# Patient Record
Sex: Male | Born: 1950
Health system: Southern US, Community
[De-identification: ages and names within clinical notes are randomized; demographics above are authoritative.]

## PROBLEM LIST (undated history)

## (undated) DIAGNOSIS — Z972 Presence of dental prosthetic device (complete) (partial): Secondary | ICD-10-CM

## (undated) DIAGNOSIS — I10 Essential (primary) hypertension: Secondary | ICD-10-CM

## (undated) DIAGNOSIS — G709 Myoneural disorder, unspecified: Secondary | ICD-10-CM

## (undated) DIAGNOSIS — M1990 Unspecified osteoarthritis, unspecified site: Secondary | ICD-10-CM

## (undated) DIAGNOSIS — M48062 Spinal stenosis, lumbar region with neurogenic claudication: Secondary | ICD-10-CM

## (undated) DIAGNOSIS — M545 Low back pain, unspecified: Secondary | ICD-10-CM

## (undated) DIAGNOSIS — G8929 Other chronic pain: Secondary | ICD-10-CM

## (undated) DIAGNOSIS — C801 Malignant (primary) neoplasm, unspecified: Secondary | ICD-10-CM

## (undated) DIAGNOSIS — E7800 Pure hypercholesterolemia, unspecified: Secondary | ICD-10-CM

## (undated) DIAGNOSIS — G35 Multiple sclerosis: Secondary | ICD-10-CM

## (undated) DIAGNOSIS — M5416 Radiculopathy, lumbar region: Secondary | ICD-10-CM

## (undated) DIAGNOSIS — I447 Left bundle-branch block, unspecified: Secondary | ICD-10-CM

## (undated) HISTORY — PX: MULTIPLE TOOTH EXTRACTIONS: SHX2053

## (undated) HISTORY — PX: NO PAST SURGERIES: SHX2092

---

## 2011-04-19 ENCOUNTER — Other Ambulatory Visit: Payer: Self-pay | Admitting: Urology

## 2011-05-01 ENCOUNTER — Encounter (HOSPITAL_COMMUNITY): Payer: Self-pay | Admitting: Pharmacy Technician

## 2011-05-04 ENCOUNTER — Encounter (HOSPITAL_COMMUNITY)
Admission: RE | Admit: 2011-05-04 | Discharge: 2011-05-04 | Disposition: A | Discharge status: Home / Self Care | Payer: Medicare Other | Source: Ambulatory Visit | Attending: Urology | Admitting: Urology

## 2011-05-04 ENCOUNTER — Encounter (HOSPITAL_COMMUNITY): Payer: Self-pay

## 2011-05-04 ENCOUNTER — Ambulatory Visit (HOSPITAL_COMMUNITY)
Admission: RE | Admit: 2011-05-04 | Discharge: 2011-05-04 | Disposition: A | Discharge status: Home / Self Care | Payer: Medicare Other | Source: Ambulatory Visit | Attending: Urology | Admitting: Urology

## 2011-05-04 ENCOUNTER — Other Ambulatory Visit: Payer: Self-pay

## 2011-05-04 DIAGNOSIS — Z01810 Encounter for preprocedural cardiovascular examination: Secondary | ICD-10-CM | POA: Insufficient documentation

## 2011-05-04 DIAGNOSIS — Z01818 Encounter for other preprocedural examination: Secondary | ICD-10-CM | POA: Insufficient documentation

## 2011-05-04 DIAGNOSIS — C61 Malignant neoplasm of prostate: Secondary | ICD-10-CM | POA: Insufficient documentation

## 2011-05-04 DIAGNOSIS — Z01812 Encounter for preprocedural laboratory examination: Secondary | ICD-10-CM | POA: Insufficient documentation

## 2011-05-04 HISTORY — DX: Other chronic pain: G89.29

## 2011-05-04 HISTORY — DX: Low back pain: M54.5

## 2011-05-04 HISTORY — DX: Multiple sclerosis: G35

## 2011-05-04 HISTORY — DX: Malignant (primary) neoplasm, unspecified: C80.1

## 2011-05-04 HISTORY — DX: Low back pain, unspecified: M54.50

## 2011-05-04 LAB — CBC
MCHC: 34.2 g/dL (ref 30.0–36.0)
Platelets: 301 10*3/uL (ref 150–400)
RDW: 13.4 % (ref 11.5–15.5)

## 2011-05-04 LAB — SURGICAL PCR SCREEN
MRSA, PCR: NEGATIVE
Staphylococcus aureus: NEGATIVE

## 2011-05-04 LAB — BASIC METABOLIC PANEL
GFR calc Af Amer: 87 mL/min — ABNORMAL LOW (ref >90)
GFR calc non Af Amer: 75 mL/min — ABNORMAL LOW (ref >90)
Potassium: 3.6 mEq/L (ref 3.5–5.1)
Sodium: 139 mEq/L (ref 135–145)

## 2011-05-04 NOTE — Patient Instructions (Signed)
20 Paul Sutton  05/04/2011   Your procedure is scheduled on: 04-21-11 Report to [facility] at 0515   AM.  Call this number if you have problems the morning of surgery: [phone]   Remember:   Do not eat food or drink liquids:After Midnight.  Take these medicines the morning of surgery with A SIP OF WATER: amlodipine, pravastatin   Do not wear jewelry...  Do not wear lotions, powders, or perfumes. Do not wear deodorant.  .  Do not bring valuables to the hospital.  Contacts, dentures or bridgework may not be worn into surgery.  Leave suitcase in the car. After surgery it may be brought to your room.  For patients admitted to the hospital, checkout time is 11:00 AM the day of discharge.     Special Instructions: CHG Shower Use Special Wash: 1/2 bottle night before surgery and 1/2 bottle morning of surgery.neck down avoid private area   Please read over the following fact sheets that you were given: MRSA Information  Jasmine December Maheen Cwikla rn [facility] pre op nurse phone number [phone] call if needed

## 2011-05-07 NOTE — Pre-Procedure Instructions (Signed)
Spoke with dr Council Mechanic, ok with 05-04-11 ekg results, faxed ekg results to dr borden and left message with chasity little.

## 2011-05-10 ENCOUNTER — Inpatient Hospital Stay (HOSPITAL_COMMUNITY): Admission: RE | Admit: 2011-05-10 | Payer: Medicare Other | Source: Ambulatory Visit | Admitting: Urology

## 2011-05-10 ENCOUNTER — Encounter (HOSPITAL_COMMUNITY): Admission: RE | Payer: Self-pay | Source: Ambulatory Visit

## 2011-05-10 SURGERY — ROBOTIC ASSISTED LAPAROSCOPIC RADICAL PROSTATECTOMY LEVEL 2
Anesthesia: General

## 2011-05-21 ENCOUNTER — Other Ambulatory Visit: Payer: Self-pay | Admitting: Urology

## 2011-05-25 ENCOUNTER — Encounter (HOSPITAL_COMMUNITY): Payer: Self-pay | Admitting: Pharmacy Technician

## 2011-05-30 ENCOUNTER — Encounter (HOSPITAL_COMMUNITY): Payer: Self-pay

## 2011-05-30 ENCOUNTER — Encounter (HOSPITAL_COMMUNITY)
Admission: RE | Admit: 2011-05-30 | Discharge: 2011-05-30 | Disposition: A | Discharge status: Home / Self Care | Payer: Medicare Other | Source: Ambulatory Visit | Attending: Urology | Admitting: Urology

## 2011-05-30 HISTORY — DX: Myoneural disorder, unspecified: G70.9

## 2011-05-30 HISTORY — DX: Unspecified osteoarthritis, unspecified site: M19.90

## 2011-05-30 HISTORY — DX: Essential (primary) hypertension: I10

## 2011-05-30 LAB — BASIC METABOLIC PANEL
Chloride: 104 mEq/L (ref 96–112)
Creatinine, Ser: 1.01 mg/dL (ref 0.50–1.35)
GFR calc Af Amer: >90 mL/min (ref >90)
GFR calc non Af Amer: 78 mL/min — ABNORMAL LOW (ref >90)
Potassium: 4.3 mEq/L (ref 3.5–5.1)

## 2011-05-30 LAB — CBC
MCHC: 34.1 g/dL (ref 30.0–36.0)
Platelets: 373 10*3/uL (ref 150–400)
RDW: 13.5 % (ref 11.5–15.5)
WBC: 5.1 10*3/uL (ref 4.0–10.5)

## 2011-05-30 LAB — SURGICAL PCR SCREEN: Staphylococcus aureus: NEGATIVE

## 2011-05-30 MED ORDER — CEFAZOLIN SODIUM 1-5 GM-% IV SOLN: 1.0000 g | INTRAVENOUS | Status: DC

## 2011-05-30 NOTE — Pre-Procedure Instructions (Signed)
05/11/11 LOV with cardiology Dr Eldridge Dace on chart  Stress Test 05/15/11 on chart  EKG 05/15/11 on chart  05/04/11 CXR in [facility]

## 2011-05-30 NOTE — Patient Instructions (Signed)
20 Paul Sutton  05/30/2011   Your procedure is scheduled on:  06/07/11 [id number]  Report to [facility] at 0515 AM.  Call this number if you have problems the morning of surgery: [phone]   Remember:   Do not eat food:After Midnight.  May have clear liquids:until Midnight .    Take these medicines the morning of surgery with A SIP OF WATER:    Do not wear jewelry,   Do not wear lotions, powders, or perfumes.  .  Do not bring valuables to the hospital.  Contacts, dentures or bridgework may not be worn into surgery.             Leave suitcase in car.   Someone to bring up to room after surgery.              The day your are discharged from hospital checkout time will be around 1100am.           Special Instructions: CHG Shower Use Special Wash: 1/2 bottle night before surgery and 1/2 bottle morning of surgery. Shower chin to toes with CHG.  Wash face and private parts with regular soap.    Please read over the following fact sheets that you were given: MRSA Information, Blood Transfusion Fact Sheet, Incentive Spirometry Fact Sheet, coughing and deep breathing exercises, leg exercises

## 2011-06-06 NOTE — H&P (Signed)
History of Present Illness  Mr. Lindblad is a 61 year old patient with a history of recurrent urinary tract infection/prostatitis and a history of an elevated PSA. His elevated PSA has apparently been felt to be related to his prostatitis in the past. However, his PSA was found to be rising and persistently elevated more recently. His PSA from 02/03/11 was 8.4 with a free percentage of 5%. This prompted a prostate biopsy performed by Dr. Ian Bushman on 03/16/11 which indicated Gleason 4+3 = 7 adenocarcinoma of the prostate with 6 of 10 cores involved with malignancy. He does have a paternal family history of prostate cancer with his father having been diagnosed in his 12s and undergoing radical prostatectomy for treatment. He has not proceeded with a radiation oncology consultation although is most interested in surgical treatment.  He did undergo staging by Dr. Ian Bushman including a CT scan of the abdomen and pelvis as well as a bone scan. These studies were both performed on 04/03/11 with neither indicating concern for metastatic disease. He was evaluated by me in January 2013 and elected to undergo surgical treatment with a robotic prostatectomy.  TNM stage: cT2b N0 M0 PSA: 8.4 Gleason score: 4+3 = 7 Biopsy (03/16/11, read by Dr. Hollice Espy at [facility] pathology - case number [id number]): 6/10 cores positive   Left: Gleason 3+3 = 6 involving less than 1% of 1/5 biopsy cores with evidence of acute and chronic inflammation    Right: Gleason 4+3 = 7 involving 65% of the total tissue including 5 out of 5 biopsy cores. Prostate volume: 20 cc  Nomogram: OC disease: 37% EPE: 70% SVI: 31% LNI: 5.4% PFS (surgery): 70%, 58%  Urinary function: He does have mild to moderate symptoms including intermittency, urgency, weak stream, and straining. IPSS: 11. These symptoms are not particular bothersome to him. Erectile function: He does have moderate erectile dysfunction. SHIM score: 15. He estimates that he can  obtain an erection adequate for intercourse approximately 50% of the time.  Interval history:  He was scheduled to undergo a robotic prostatectomy last week. However, this was canceled for 2 reasons. He was noted to have persistent bacteriuria and his urine culture subsequently demonstrated a resistant Escherichia coli sensitive to nitrofurantoin but resistant to doxycycline. He therefore follows up today for further evaluation including a repeat urinalysis and to check a postvoid residual to ensure that it is not significantly elevated. In addition, he was noted to have a bundle branch block on his preoperative EKG. He therefore has undergone a cardiac evaluation by Dr. Manuella Ghazi. He had a stress test earlier today and is awaiting the final results. He remains asymptomatic from his bacteria and has had no chest pain, shortness of breath, or palpitations.   Past Medical History Problems  1. History of  Anxiety (Symptom) 300.00 2. History of  Arthritis V13.4 3. History of  Generalized Multiple Sclerosis 340 4. History of  Hypercholesterolemia 272.0 5. History of  Hypertension 401.9  Surgical History Problems  1. History of  No Surgical Problems  Current Meds 1. AmLODIPine Besylate 10 MG Oral Tablet; Therapy: (Recorded:29Jan2013) to 2. Baclofen 10 MG Oral Tablet; Therapy: (Recorded:29Jan2013) to 3. Diazepam 5 MG Oral Tablet; Therapy: (Recorded:29Jan2013) to 4. Lorcet Plus 7.5-650 MG Oral Tablet; Therapy: (Recorded:29Jan2013) to 5. Nitrofurantoin Monohyd Macro 100 MG Oral Capsule; TAKE 1 CAPSULE EVERY 12 HOURS  DAILY; Therapy: 18Feb2013 to (Complete:04Mar2013)  Requested for: 18Feb2013; Last  Rx:18Feb2013 6. TraMADol HCl 50 MG Oral Tablet; Therapy: (Recorded:29Jan2013) to 7. Viagra 100 MG  Oral Tablet; Therapy: (Recorded:29Jan2013) to  Allergies Medication  1. No Known Drug Allergies  Family History Problems  1. Family history of  Diabetes Mellitus V18.0 2. Family history of  Heart  Disease V17.49 3. Paternal history of  Prostate Cancer V16.42 4. Family history of  Stroke Syndrome V17.1  Social History Problems  1. Activities Of Daily Living 2. Current Every Day Smoker 305.1 smokes about a half a pack daily - has smoked since about 1978 3. Exercise Habits No regular exercise but he is active at home with all activities and light chores. 4. Living Independently 5. Marital History - Divorced V61.03 6. Physical Disability Affecting Ability To Work 7. Self-reliant In Usual Daily Activities Denied  8. History of  Alcohol Use  Vitals Vital Signs [Data Includes: Last 1 Day]  26Feb2013 02:34PM  Blood Pressure: 157 / 80 Heart Rate: 79  Physical Exam Constitutional: Well nourished and well developed . No acute distress.  Pulmonary: No respiratory distress and normal respiratory rhythm and effort.  Cardiovascular: Heart rate and rhythm are normal . No peripheral edema.    Results/Data Urine [Data Includes: Last 1 Day]   26Feb2013  COLOR YELLOW   APPEARANCE CLEAR   SPECIFIC GRAVITY 1.025   pH 5.5   GLUCOSE NEG mg/dL  BILIRUBIN NEG   KETONE NEG mg/dL  BLOOD TRACE   PROTEIN NEG mg/dL  UROBILINOGEN 0.2 mg/dL  NITRITE NEG   LEUKOCYTE ESTERASE NEG   SQUAMOUS EPITHELIAL/HPF RARE   WBC 4-6 WBC/hpf  RBC 0-3 RBC/hpf  BACTERIA RARE   CRYSTALS NONE SEEN   CASTS Granular casts noted   Other MOD MUCUS     PVR: 33 cc   Plan Asymptomatic Bacteruria (791.9)  1. URINE CULTURE  Done: 26Feb2013 Health Maintenance (V70.0)  2. UA With REFLEX  Done: 26Feb2013 02:05PM Prostate Cancer (185)  3. Follow-up Office  Follow-up  Requested for: 26Feb2013  Discussion/Summary 1. Prostate cancer: He appears to be on appropriate antibiotic therapy at this time and will complete his course. I will await the final results of his stress testing and cardiac evaluation and assuming that he would remain at low risk from a cardiac standpoint, he will then be rescheduled for a left  nerve sparing robotic prostatectomy and pelvic lymphadenectomy as previously planned.  He has been evaluated by Dr. Eldridge Dace and underwent a stress test with no inducible ischemia and was felt to be at low risk for cardiac complications with his upcoming surgery.   CC: Dr. Debroah Loop Dr. Cheryln Manly   Verified Results URINE CULTURE1 26Feb2013 04:10PM1 Lyda Perone  Source : Brigid Re CATCH   Test Name Result Flag Reference  CULTURE, URINE1 Culture, Urine1    ===== COLONY COUNT: =====  NO GROWTH   FINAL REPORT: NO GROWTH     1. Amended By: Heloise Purpura; 05/17/2011 1:00 PMEST  Signatures Electronically signed by : Heloise Purpura, M.D.; May 17 2011  1:00PM

## 2011-06-07 ENCOUNTER — Encounter (HOSPITAL_COMMUNITY): Payer: Self-pay | Admitting: *Deleted

## 2011-06-07 ENCOUNTER — Encounter (HOSPITAL_COMMUNITY): Payer: Self-pay | Admitting: Anesthesiology

## 2011-06-07 ENCOUNTER — Ambulatory Visit (HOSPITAL_COMMUNITY): Payer: Medicare Other | Admitting: Anesthesiology

## 2011-06-07 ENCOUNTER — Inpatient Hospital Stay (HOSPITAL_COMMUNITY)
Admission: RE | Admit: 2011-06-07 | Discharge: 2011-06-08 | DRG: 708 | Disposition: A | Discharge status: Home / Self Care | Payer: Medicare Other | Source: Ambulatory Visit | Attending: Urology | Admitting: Urology

## 2011-06-07 ENCOUNTER — Encounter (HOSPITAL_COMMUNITY)
Admission: RE | Disposition: A | Discharge status: Home / Self Care | Payer: Self-pay | Source: Ambulatory Visit | Attending: Urology

## 2011-06-07 DIAGNOSIS — Z01812 Encounter for preprocedural laboratory examination: Secondary | ICD-10-CM

## 2011-06-07 DIAGNOSIS — F17200 Nicotine dependence, unspecified, uncomplicated: Secondary | ICD-10-CM | POA: Diagnosis present

## 2011-06-07 DIAGNOSIS — I10 Essential (primary) hypertension: Secondary | ICD-10-CM | POA: Diagnosis present

## 2011-06-07 DIAGNOSIS — C61 Malignant neoplasm of prostate: Principal | ICD-10-CM | POA: Diagnosis present

## 2011-06-07 HISTORY — PX: ROBOT ASSISTED LAPAROSCOPIC RADICAL PROSTATECTOMY: SHX5141

## 2011-06-07 LAB — HEMOGLOBIN AND HEMATOCRIT, BLOOD: Hemoglobin: 13.4 g/dL (ref 13.0–17.0)

## 2011-06-07 LAB — TYPE AND SCREEN: ABO/RH(D): O POS

## 2011-06-07 SURGERY — ROBOTIC ASSISTED LAPAROSCOPIC RADICAL PROSTATECTOMY LEVEL 2
Anesthesia: General | Site: Pelvis | Wound class: Clean Contaminated

## 2011-06-07 MED ORDER — LIDOCAINE HCL (CARDIAC) 20 MG/ML IV SOLN
INTRAVENOUS | Status: DC | PRN
  Administered 2011-06-07: 100 mg via INTRAVENOUS

## 2011-06-07 MED ORDER — CEFAZOLIN SODIUM 1-5 GM-% IV SOLN
INTRAVENOUS | Status: AC
  Filled 2011-06-07: qty 50

## 2011-06-07 MED ORDER — MEPERIDINE HCL 50 MG/ML IJ SOLN: 6.2500 mg | INTRAMUSCULAR | Status: DC | PRN

## 2011-06-07 MED ORDER — DIPHENHYDRAMINE HCL 12.5 MG/5ML PO ELIX: 12.5000 mg | ORAL_SOLUTION | Freq: Four times a day (QID) | ORAL | Status: DC | PRN

## 2011-06-07 MED ORDER — DOCUSATE SODIUM 100 MG PO CAPS
100.0000 mg | ORAL_CAPSULE | Freq: Two times a day (BID) | ORAL | Status: DC
  Administered 2011-06-07 – 2011-06-08 (×2): 100 mg via ORAL
  Filled 2011-06-07 (×3): qty 1

## 2011-06-07 MED ORDER — GLYCOPYRROLATE 0.2 MG/ML IJ SOLN
INTRAMUSCULAR | Status: DC | PRN
  Administered 2011-06-07: 0.4 mg via INTRAVENOUS

## 2011-06-07 MED ORDER — CIPROFLOXACIN HCL 500 MG PO TABS: 500.0000 mg | ORAL_TABLET | Freq: Two times a day (BID) | ORAL | Status: AC

## 2011-06-07 MED ORDER — CISATRACURIUM BESYLATE 2 MG/ML IV SOLN
INTRAVENOUS | Status: DC | PRN
  Administered 2011-06-07 (×2): 2 mg via INTRAVENOUS
  Administered 2011-06-07: 12 mg via INTRAVENOUS

## 2011-06-07 MED ORDER — AMLODIPINE BESYLATE 10 MG PO TABS
10.0000 mg | ORAL_TABLET | Freq: Every day | ORAL | Status: DC
  Administered 2011-06-08: 10 mg via ORAL
  Filled 2011-06-07: qty 1

## 2011-06-07 MED ORDER — SIMVASTATIN 10 MG PO TABS
10.0000 mg | ORAL_TABLET | Freq: Every day | ORAL | Status: DC
  Administered 2011-06-07: 10 mg via ORAL
  Filled 2011-06-07 (×2): qty 1

## 2011-06-07 MED ORDER — CEFAZOLIN SODIUM 1-5 GM-% IV SOLN
1.0000 g | INTRAVENOUS | Status: AC
  Administered 2011-06-07: 1 g via INTRAVENOUS

## 2011-06-07 MED ORDER — ONDANSETRON HCL 4 MG/2ML IJ SOLN
4.0000 mg | INTRAMUSCULAR | Status: DC | PRN
  Administered 2011-06-07: 4 mg via INTRAVENOUS

## 2011-06-07 MED ORDER — HYDROMORPHONE HCL PF 1 MG/ML IJ SOLN
0.2500 mg | INTRAMUSCULAR | Status: DC | PRN
  Administered 2011-06-07 (×2): 0.5 mg via INTRAVENOUS

## 2011-06-07 MED ORDER — DIPHENHYDRAMINE HCL 50 MG/ML IJ SOLN: 12.5000 mg | Freq: Four times a day (QID) | INTRAMUSCULAR | Status: DC | PRN

## 2011-06-07 MED ORDER — HEPARIN SODIUM (PORCINE) 1000 UNIT/ML IJ SOLN
INTRAMUSCULAR | Status: AC
  Filled 2011-06-07: qty 1

## 2011-06-07 MED ORDER — NEOSTIGMINE METHYLSULFATE 1 MG/ML IJ SOLN
INTRAMUSCULAR | Status: DC | PRN
  Administered 2011-06-07: 3 mg via INTRAVENOUS

## 2011-06-07 MED ORDER — BACLOFEN 10 MG PO TABS
10.0000 mg | ORAL_TABLET | Freq: Two times a day (BID) | ORAL | Status: DC | PRN
  Filled 2011-06-07: qty 1

## 2011-06-07 MED ORDER — CEFAZOLIN SODIUM 1-5 GM-% IV SOLN
1.0000 g | Freq: Three times a day (TID) | INTRAVENOUS | Status: AC
  Administered 2011-06-07 (×2): 1 g via INTRAVENOUS
  Filled 2011-06-07 (×3): qty 50

## 2011-06-07 MED ORDER — SUFENTANIL CITRATE 50 MCG/ML IV SOLN
INTRAVENOUS | Status: DC | PRN
  Administered 2011-06-07 (×5): 10 ug via INTRAVENOUS

## 2011-06-07 MED ORDER — KCL IN DEXTROSE-NACL 20-5-0.45 MEQ/L-%-% IV SOLN
INTRAVENOUS | Status: DC
  Administered 2011-06-08: 01:00:00 via INTRAVENOUS
  Filled 2011-06-07 (×5): qty 1000

## 2011-06-07 MED ORDER — INDIGOTINDISULFONATE SODIUM 8 MG/ML IJ SOLN
INTRAMUSCULAR | Status: DC | PRN
  Administered 2011-06-07 (×2): 5 mL via INTRAVENOUS

## 2011-06-07 MED ORDER — HYDROMORPHONE HCL PF 1 MG/ML IJ SOLN
INTRAMUSCULAR | Status: AC
  Filled 2011-06-07: qty 1

## 2011-06-07 MED ORDER — CIPROFLOXACIN HCL 500 MG PO TABS
500.0000 mg | ORAL_TABLET | Freq: Two times a day (BID) | ORAL | Status: DC
  Administered 2011-06-07 – 2011-06-08 (×2): 500 mg via ORAL
  Filled 2011-06-07 (×3): qty 1

## 2011-06-07 MED ORDER — PROMETHAZINE HCL 25 MG/ML IJ SOLN: 6.2500 mg | INTRAMUSCULAR | Status: DC | PRN

## 2011-06-07 MED ORDER — LACTATED RINGERS IV SOLN: INTRAVENOUS | Status: DC

## 2011-06-07 MED ORDER — KETOROLAC TROMETHAMINE 15 MG/ML IJ SOLN
INTRAMUSCULAR | Status: AC
  Filled 2011-06-07: qty 1

## 2011-06-07 MED ORDER — PROMETHAZINE HCL 25 MG/ML IJ SOLN
12.5000 mg | Freq: Four times a day (QID) | INTRAMUSCULAR | Status: DC | PRN
  Administered 2011-06-07: 12.5 mg via INTRAVENOUS
  Filled 2011-06-07: qty 1

## 2011-06-07 MED ORDER — BUPIVACAINE-EPINEPHRINE 0.25% -1:200000 IJ SOLN
INTRAMUSCULAR | Status: DC | PRN
  Administered 2011-06-07: 25 mL

## 2011-06-07 MED ORDER — LACTATED RINGERS IV SOLN
INTRAVENOUS | Status: DC | PRN
  Administered 2011-06-07 (×2): via INTRAVENOUS

## 2011-06-07 MED ORDER — ACETAMINOPHEN 325 MG PO TABS: 650.0000 mg | ORAL_TABLET | ORAL | Status: DC | PRN

## 2011-06-07 MED ORDER — HYDROMORPHONE HCL PF 1 MG/ML IJ SOLN
INTRAMUSCULAR | Status: DC | PRN
  Administered 2011-06-07: 1 mg via INTRAVENOUS
  Administered 2011-06-07: 0.5 mg via INTRAVENOUS
  Administered 2011-06-07: .5 mg via INTRAVENOUS

## 2011-06-07 MED ORDER — MIDAZOLAM HCL 5 MG/5ML IJ SOLN
INTRAMUSCULAR | Status: DC | PRN
  Administered 2011-06-07: 2 mg via INTRAVENOUS

## 2011-06-07 MED ORDER — SODIUM CHLORIDE 0.9 % IR SOLN
Status: DC | PRN
  Administered 2011-06-07: 1000 mL

## 2011-06-07 MED ORDER — BUPIVACAINE-EPINEPHRINE PF 0.25-1:200000 % IJ SOLN
INTRAMUSCULAR | Status: AC
  Filled 2011-06-07: qty 30

## 2011-06-07 MED ORDER — ACETAMINOPHEN 10 MG/ML IV SOLN
INTRAVENOUS | Status: DC | PRN
  Administered 2011-06-07: 1000 mg via INTRAVENOUS

## 2011-06-07 MED ORDER — ONDANSETRON HCL 4 MG/2ML IJ SOLN
INTRAMUSCULAR | Status: AC
  Administered 2011-06-07: 4 mg via INTRAVENOUS
  Filled 2011-06-07: qty 2

## 2011-06-07 MED ORDER — HYDROCODONE-ACETAMINOPHEN 5-325 MG PO TABS: 1.0000 | ORAL_TABLET | Freq: Four times a day (QID) | ORAL | Status: AC | PRN

## 2011-06-07 MED ORDER — KCL IN DEXTROSE-NACL 20-5-0.45 MEQ/L-%-% IV SOLN
INTRAVENOUS | Status: AC
  Administered 2011-06-07: 1000 mL
  Filled 2011-06-07: qty 1000

## 2011-06-07 MED ORDER — PROPOFOL 10 MG/ML IV EMUL
INTRAVENOUS | Status: DC | PRN
  Administered 2011-06-07: 150 mg via INTRAVENOUS

## 2011-06-07 MED ORDER — STERILE WATER FOR IRRIGATION IR SOLN
Status: DC | PRN
  Administered 2011-06-07: 3000 mL

## 2011-06-07 MED ORDER — KETOROLAC TROMETHAMINE 15 MG/ML IJ SOLN
15.0000 mg | Freq: Four times a day (QID) | INTRAMUSCULAR | Status: DC
  Administered 2011-06-07 – 2011-06-08 (×5): 15 mg via INTRAVENOUS
  Filled 2011-06-07 (×5): qty 1

## 2011-06-07 MED ORDER — ACETAMINOPHEN 10 MG/ML IV SOLN
INTRAVENOUS | Status: AC
  Filled 2011-06-07: qty 100

## 2011-06-07 MED ORDER — INDIGOTINDISULFONATE SODIUM 8 MG/ML IJ SOLN
INTRAMUSCULAR | Status: AC
  Filled 2011-06-07: qty 10

## 2011-06-07 MED ORDER — LACTATED RINGERS IV SOLN
INTRAVENOUS | Status: DC | PRN
  Administered 2011-06-07: 08:00:00

## 2011-06-07 MED ORDER — SODIUM CHLORIDE 0.9 % IV BOLUS (SEPSIS)
1000.0000 mL | Freq: Once | INTRAVENOUS | Status: AC
  Administered 2011-06-07: 1000 mL via INTRAVENOUS

## 2011-06-07 MED ORDER — EPHEDRINE SULFATE 50 MG/ML IJ SOLN
INTRAMUSCULAR | Status: DC | PRN
  Administered 2011-06-07: 10 mg via INTRAVENOUS
  Administered 2011-06-07 (×2): 5 mg via INTRAVENOUS

## 2011-06-07 MED ORDER — MORPHINE SULFATE 2 MG/ML IJ SOLN: 2.0000 mg | INTRAMUSCULAR | Status: DC | PRN

## 2011-06-07 MED ORDER — ONDANSETRON HCL 4 MG/2ML IJ SOLN
INTRAMUSCULAR | Status: DC | PRN
  Administered 2011-06-07: 4 mg via INTRAVENOUS

## 2011-06-07 SURGICAL SUPPLY — 39 items
CANISTER SUCTION 2500CC (MISCELLANEOUS) ×3 IMPLANT
CATH ROBINSON RED A/P 8FR (CATHETERS) ×3 IMPLANT
CHLORAPREP W/TINT 26ML (MISCELLANEOUS) ×3 IMPLANT
CLIP LIGATING HEM O LOK PURPLE (MISCELLANEOUS) ×6 IMPLANT
CLOTH BEACON ORANGE TIMEOUT ST (SAFETY) ×3 IMPLANT
CORD HIGH FREQUENCY UNIPOLAR (ELECTROSURGICAL) ×3 IMPLANT
COVER SURGICAL LIGHT HANDLE (MISCELLANEOUS) ×3 IMPLANT
COVER TIP SHEARS 8 DVNC (MISCELLANEOUS) ×2 IMPLANT
COVER TIP SHEARS 8MM DA VINCI (MISCELLANEOUS) ×1
CUTTER ECHEON FLEX ENDO 45 340 (ENDOMECHANICALS) ×3 IMPLANT
DECANTER SPIKE VIAL GLASS SM (MISCELLANEOUS) IMPLANT
DRAPE SURG IRRIG POUCH 19X23 (DRAPES) ×3 IMPLANT
DRSG TEGADERM 2-3/8X2-3/4 SM (GAUZE/BANDAGES/DRESSINGS) ×15 IMPLANT
DRSG TEGADERM 4X4.75 (GAUZE/BANDAGES/DRESSINGS) ×3 IMPLANT
DRSG TEGADERM 6X8 (GAUZE/BANDAGES/DRESSINGS) IMPLANT
ELECT REM PT RETURN 9FT ADLT (ELECTROSURGICAL) ×3
ELECTRODE REM PT RTRN 9FT ADLT (ELECTROSURGICAL) ×2 IMPLANT
GLOVE BIO SURGEON STRL SZ 6.5 (GLOVE) ×3 IMPLANT
GLOVE BIOGEL M STRL SZ7.5 (GLOVE) ×6 IMPLANT
GOWN STRL NON-REIN LRG LVL3 (GOWN DISPOSABLE) ×9 IMPLANT
GOWN STRL REIN XL XLG (GOWN DISPOSABLE) ×3 IMPLANT
HOLDER FOLEY CATH W/STRAP (MISCELLANEOUS) ×3 IMPLANT
IV LACTATED RINGERS 1000ML (IV SOLUTION) ×3 IMPLANT
KIT ACCESSORY DA VINCI DISP (KITS) ×1
KIT ACCESSORY DVNC DISP (KITS) ×2 IMPLANT
NDL SAFETY ECLIPSE 18X1.5 (NEEDLE) ×2 IMPLANT
NEEDLE HYPO 18GX1.5 SHARP (NEEDLE) ×1
PACK ROBOT UROLOGY CUSTOM (CUSTOM PROCEDURE TRAY) ×3 IMPLANT
RELOAD GREEN ECHELON 45 (STAPLE) ×3 IMPLANT
SET TUBE IRRIG SUCTION NO TIP (IRRIGATION / IRRIGATOR) ×3 IMPLANT
SOLUTION ELECTROLUBE (MISCELLANEOUS) ×3 IMPLANT
SPONGE GAUZE 4X4 12PLY (GAUZE/BANDAGES/DRESSINGS) ×3 IMPLANT
SUT MNCRL 3 0 VIOLET RB1 (SUTURE) ×2 IMPLANT
SUT MONOCRYL 3 0 RB1 (SUTURE) ×1
SUT VICRYL 0 UR6 27IN ABS (SUTURE) ×6 IMPLANT
SYR 27GX1/2 1ML LL SAFETY (SYRINGE) ×3 IMPLANT
TOWEL OR 17X26 10 PK STRL BLUE (TOWEL DISPOSABLE) ×3 IMPLANT
TOWEL OR NON WOVEN STRL DISP B (DISPOSABLE) ×3 IMPLANT
WATER STERILE IRR 1500ML POUR (IV SOLUTION) ×6 IMPLANT

## 2011-06-07 NOTE — Anesthesia Postprocedure Evaluation (Signed)
  Anesthesia Post-op Note  Patient: Paul Sutton  Procedure(s) Performed: Procedure(s) (LRB): ROBOTIC ASSISTED LAPAROSCOPIC RADICAL PROSTATECTOMY LEVEL 2 (N/A) LYMPHADENECTOMY (Bilateral)  Patient Location: PACU  Anesthesia Type: General  Level of Consciousness: awake and alert   Airway and Oxygen Therapy: Patient Spontanous Breathing  Post-op Pain: mild  Post-op Assessment: Post-op Vital signs reviewed, Patient's Cardiovascular Status Stable, Respiratory Function Stable, Patent Airway and No signs of Nausea or vomiting  Post-op Vital Signs: stable  Complications: No apparent anesthesia complications

## 2011-06-07 NOTE — Discharge Instructions (Signed)

## 2011-06-07 NOTE — Interval H&P Note (Signed)
History and Physical Interval Note:  06/07/2011 7:17 AM  Paul Sutton  has presented today for surgery, with the diagnosis of Prostate Cancer  The various methods of treatment have been discussed with the patient. After consideration of risks, benefits and other options for treatment, the patient has consented to  Procedure(s) (LRB): ROBOTIC ASSISTED LAPAROSCOPIC RADICAL PROSTATECTOMY LEVEL 2 (N/A) LYMPHADENECTOMY (Bilateral) as a surgical intervention .  The patients' history has been reviewed, patient examined, no change in status, stable for surgery.  Questions were answered to the patient's satisfaction.     Nova Schmuhl,LES

## 2011-06-07 NOTE — Op Note (Signed)
Preoperative diagnosis: Clinically localized adenocarcinoma of the prostate (clinical stage cT1c N0 M0)  Postoperative diagnosis: Clinically localized adenocarcinoma of the prostate (clinical stage cT1c N0 M0)  Procedure:  1. Robotic assisted laparoscopic radical prostatectomy (left nerve sparing) 2. Bilateral robotic assisted laparoscopic pelvic lymphadenectomy  Surgeon: [name]. M.D.  Assistant(s):  Pecola Leisure, PA-C  Anesthesia: General  Complications: None  EBL: 75 mL  IVF:  1000 mL crystalloid  Specimens: 1. Prostate and seminal vesicles 2. Right pelvic lymph nodes 3. Left pelvic lymph nodes  Disposition of specimens: Pathology  Drains: 1. 20 Fr coude catheter 2. # 19 Blake pelvic drain  Indication: Paul Sutton is a 61 y.o. patient with clinically localized prostate cancer.  After a thorough review of the management options for treatment of prostate cancer, he elected to proceed with surgical therapy and the above procedure(s).  We have discussed the potential benefits and risks of the procedure, side effects of the proposed treatment, the likelihood of the patient achieving the goals of the procedure, and any potential problems that might occur during the procedure or recuperation. Informed consent has been obtained.  Description of procedure:  The patient was taken to the operating room and a general anesthetic was administered. He was given preoperative antibiotics, placed in the dorsal lithotomy position, and prepped and draped in the usual sterile fashion. Next a preoperative timeout was performed. A urethral catheter was placed into the bladder and a site was selected near the umbilicus for placement of the camera port. This was placed using a standard open Hassan technique which allowed entry into the peritoneal cavity under direct vision and without difficulty. A 12 mm port was placed and a pneumoperitoneum established. The camera was then used to  inspect the abdomen and there was no evidence of any intra-abdominal injuries or other abnormalities. The remaining abdominal ports were then placed. 8 mm robotic ports were placed in the right lower quadrant, left lower quadrant, and far left lateral abdominal wall. A 5 mm port was placed in the right upper quadrant and a 12 mm port was placed in the right lateral abdominal wall for laparoscopic assistance. All ports were placed under direct vision without difficulty. The surgical cart was then docked.   Utilizing the cautery scissors, the bladder was reflected posteriorly allowing entry into the space of Retzius and identification of the endopelvic fascia and prostate. The perivesical tissue was extremely adherent to the surrounding pelvic structures possibly secondary to inflammation related to this his history of prostatitis.  This required meticulous and careful dissection but the bladder was able to be reflected and the prostate exposed without complication. The periprostatic fat was then removed from the prostate allowing full exposure of the endopelvic fascia. The endopelvic fascia was then incised from the apex back to the base of the prostate bilaterally and the underlying levator muscle fibers were swept laterally off the prostate thereby isolating the dorsal venous complex. The dorsal vein was then stapled and divided with a 45 mm Flex Echelon stapler. Attention then turned to the bladder neck which was divided anteriorly thereby allowing entry into the bladder and exposure of the urethral catheter. The catheter balloon was deflated and the catheter was brought into the operative field and used to retract the prostate anteriorly. The posterior bladder neck was then examined and was divided allowing further dissection between the bladder and prostate posteriorly until the vasa deferentia and seminal vessels were identified. The vasa deferentia were isolated, divided, and lifted  anteriorly. The seminal  vesicles were dissected down to their tips with care to control the seminal vascular arterial blood supply. These structures were then lifted anteriorly and the space between Denonvillier's fascia and the anterior rectum was developed with a combination of sharp and blunt dissection. This isolated the vascular pedicles of the prostate.  The lateral prostatic fascia on the left side of the prostate was then sharply incised allowing release of the neurovascular bundle. The vascular pedicle of the prostate on the left side was then ligated with Weck clips between the prostate and neurovascular bundle and divided with sharp cold scissor dissection resulting in neurovascular bundle preservation. On the right side, a wide non nerve sparing dissection was performed with Weck clips used to ligate the vascular pedicle of the prostate. The neurovascular bundle on the left side was then separated off the apex of the prostate and urethra.   The urethra was then sharply transected allowing the prostate specimen to be disarticulated. The pelvis was copiously irrigated and hemostasis was ensured. There was no evidence for rectal injury.  Attention then turned to the right pelvic sidewall. The fibrofatty tissue between the external iliac vein, confluence of the iliac vessels, hypogastric artery, and Cooper's ligament was dissected free from the pelvic sidewall with care to preserve the obturator nerve. Weck clips were used for lymphostasis and hemostasis. An identical procedure was performed on the contralateral side and the lymphatic packets were removed for permanent pathologic analysis. Care was taken due to the adherent tissue noted secondary to the presumed inflammatory response in the pelvis.  Attention then turned to the urethral anastomosis. A 2-0 Vicryl slip knot was placed between Denonvillier's fascia, the posterior bladder neck, and the posterior urethra to reapproximate these structures. A double-armed 3-0  Monocryl suture was then used to perform a 360 running tension-free anastomosis between the bladder neck and urethra. A new urethral catheter was then placed into the bladder and irrigated. There were no blood clots within the bladder and the anastomosis appeared to be watertight. A #19 Blake drain was then brought through the left lateral 8 mm port site and positioned appropriately within the pelvis. It was secured to the skin with a nylon suture. The surgical cart was then undocked. The right lateral 12 mm port site was closed at the fascial level with a 0 Vicryl suture placed laparoscopically. All remaining ports were then removed under direct vision. The prostate specimen was removed intact within the Endopouch retrieval bag via the periumbilical camera port site. This fascial opening was closed with two running 0 Vicryl sutures. 0.25% Marcaine was then injected into all port sites and all incisions were reapproximated at the skin level with staples. Sterile dressings were applied. The patient appeared to tolerate the procedure well and without complications. The patient was able to be extubated and transferred to the recovery unit in satisfactory condition.   [name] MD

## 2011-06-07 NOTE — Transfer of Care (Signed)
Immediate Anesthesia Transfer of Care Note  Patient: Paul Sutton  Procedure(s) Performed: Procedure(s) (LRB): ROBOTIC ASSISTED LAPAROSCOPIC RADICAL PROSTATECTOMY LEVEL 2 (N/A) LYMPHADENECTOMY (Bilateral)  Patient Location: PACU  Anesthesia Type: General  Level of Consciousness: awake, alert  and patient cooperative  Airway & Oxygen Therapy: Patient Spontanous Breathing and Patient connected to face mask oxygen  Post-op Assessment: Report given to PACU RN, Post -op Vital signs reviewed and stable and Patient moving all extremities  Post vital signs: Reviewed and stable  Complications: No apparent anesthesia complications

## 2011-06-07 NOTE — Anesthesia Preprocedure Evaluation (Signed)
Anesthesia Evaluation  Patient identified by MRN, date of birth, ID band Patient awake    Reviewed: Allergy & Precautions, H&P , NPO status , Patient's Chart, lab work & pertinent test results  Airway Mallampati: II TM Distance: >3 FB Neck ROM: Full    Dental No notable dental hx.    Pulmonary neg pulmonary ROS,  breath sounds clear to auscultation  Pulmonary exam normal       Cardiovascular hypertension, Pt. on medications negative cardio ROS  Rhythm:Regular Rate:Normal  LBBB   Neuro/Psych  Neuromuscular disease (MS) negative neurological ROS  negative psych ROS   GI/Hepatic negative GI ROS, Neg liver ROS,   Endo/Other  negative endocrine ROS  Renal/GU negative Renal ROS  negative genitourinary   Musculoskeletal negative musculoskeletal ROS (+)   Abdominal   Peds negative pediatric ROS (+)  Hematology negative hematology ROS (+)   Anesthesia Other Findings   Reproductive/Obstetrics negative OB ROS                           Anesthesia Physical Anesthesia Plan  ASA: III  Anesthesia Plan: General   Post-op Pain Management:    Induction: Intravenous  Airway Management Planned: Oral ETT  Additional Equipment:   Intra-op Plan:   Post-operative Plan: Extubation in OR  Informed Consent: I have reviewed the patients History and Physical, chart, labs and discussed the procedure including the risks, benefits and alternatives for the proposed anesthesia with the patient or authorized representative who has indicated his/her understanding and acceptance.   Dental advisory given  Plan Discussed with: CRNA  Anesthesia Plan Comments:         Anesthesia Quick Evaluation

## 2011-06-08 LAB — HEMOGLOBIN AND HEMATOCRIT, BLOOD
HCT: 37.1 % — ABNORMAL LOW (ref 39.0–52.0)
Hemoglobin: 12.5 g/dL — ABNORMAL LOW (ref 13.0–17.0)

## 2011-06-08 MED ORDER — BISACODYL 10 MG RE SUPP
10.0000 mg | Freq: Once | RECTAL | Status: AC
  Administered 2011-06-08: 10 mg via RECTAL
  Filled 2011-06-08: qty 1

## 2011-06-08 MED ORDER — HYDROCODONE-ACETAMINOPHEN 5-325 MG PO TABS: 1.0000 | ORAL_TABLET | Freq: Four times a day (QID) | ORAL | Status: DC | PRN

## 2011-06-08 NOTE — Progress Notes (Signed)
Patient ID: Paul Sutton, male   DOB: February 22, 1951, 61 y.o.   MRN: [id number]  1 Day Post-Op Subjective: The patient is doing well. He did vomit twice last night.  No nausea or vomiting overnight. Pain is adequately controlled.  Objective: Vital signs in last 24 hours: Temp:  [97.1 F (36.2 C)-99.9 F (37.7 C)] 99.9 F (37.7 C) (03/22 0536) Pulse Rate:  [54-75] 66  (03/22 0536) Resp:  [9-18] 18  (03/22 0536) BP: (125-184)/(61-84) 154/72 mmHg (03/22 0536) SpO2:  [93 %-100 %] 95 % (03/22 0536) Weight:  [74.2 kg (163 lb 9.3 oz)] 74.2 kg (163 lb 9.3 oz) (03/21 1538)  Intake/Output from previous day: 03/21 0701 - 03/22 0700 In: 5029.5 [P.O.:222; I.V.:3707.5; IV Piggyback:1100] Out: 2895 [Urine:2650; Drains:170; Blood:75] Intake/Output this shift:    Physical Exam:  General: Alert and oriented. CV: RRR Lungs: Clear bilaterally. GI: Soft, Nondistended. Incisions: Dressings intact. Urine: Clear Extremities: Nontender, no erythema, no edema.  Lab Results:  Basename 06/08/11 0440 06/07/11 1044  HGB 12.5* 13.4  HCT 37.1* 39.4      Assessment/Plan: POD# 1 s/p robotic prostatectomy.  1) SL IVF 2) Ambulate, Incentive spirometry 3) Transition to oral pain medication 4) Dulcolax suppository 5) D/C pelvic drain 6) Plan for likely discharge later today   [name]. MD   LOS: 1 day   Paul Sutton,LES 06/08/2011, 7:24 AM

## 2011-06-08 NOTE — Discharge Summary (Signed)
  Date of admission: 06/07/2011  Date of discharge: 06/08/2011  Admission diagnosis: Prostate Cancer  Discharge diagnosis: Prostate Cancer  History and Physical: For full details, please see admission history and physical. Briefly, Paul Sutton is a 61 y.o. gentleman with localized prostate cancer.  After discussing management/treatment options, he elected to proceed with surgical treatment.  Hospital Course: Paul Sutton was taken to the operating room on 06/07/2011 and underwent a robotic assisted laparoscopic radical prostatectomy. He tolerated this procedure well and without complications. Postoperatively, he was able to be transferred to a regular hospital room following recovery from anesthesia.  He was able to begin ambulating the night of surgery. He remained hemodynamically stable overnight.  He had excellent urine output with appropriately minimal output from his pelvic drain and his pelvic drain was removed on POD #1.  He was transitioned to oral pain medication, tolerated a clear liquid diet, and had met all discharge criteria and was able to be discharged home later on POD#1.  Laboratory values:  Basename 06/08/11 0440 06/07/11 1044  HGB 12.5* 13.4  HCT 37.1* 39.4    Disposition: Home  Discharge instruction: He was instructed to be ambulatory but to refrain from heavy lifting, strenuous activity, or driving. He was instructed on urethral catheter care.  Discharge medications:   Medication List  As of 06/08/2011  1:46 PM   START taking these medications         HYDROcodone-acetaminophen 5-325 MG per tablet   Commonly known as: NORCO   Take 1-2 tablets by mouth every 6 (six) hours as needed for pain.         CHANGE how you take these medications         ciprofloxacin 500 MG tablet   Commonly known as: CIPRO   Take 1 tablet (500 mg total) by mouth 2 (two) times daily. Start day prior to office visit for foley removal   What changed: doctor's instructions         CONTINUE taking these medications         amLODipine 10 MG tablet   Commonly known as: NORVASC      baclofen 10 MG tablet   Commonly known as: LIORESAL      pravastatin 20 MG tablet   Commonly known as: PRAVACHOL         STOP taking these medications         naproxen sodium 220 MG tablet          Where to get your medications    These are the prescriptions that you need to pick up.   You may get these medications from any pharmacy.         ciprofloxacin 500 MG tablet   HYDROcodone-acetaminophen 5-325 MG per tablet            Followup: He will followup in 1 week for catheter removal and to discuss his surgical pathology results.

## 2011-06-15 ENCOUNTER — Encounter (HOSPITAL_COMMUNITY): Payer: Self-pay | Admitting: Urology

## 2016-07-31 ENCOUNTER — Encounter (INDEPENDENT_AMBULATORY_CARE_PROVIDER_SITE_OTHER): Payer: Self-pay | Admitting: Specialist

## 2016-07-31 ENCOUNTER — Encounter (INDEPENDENT_AMBULATORY_CARE_PROVIDER_SITE_OTHER): Payer: Self-pay

## 2016-07-31 ENCOUNTER — Ambulatory Visit (INDEPENDENT_AMBULATORY_CARE_PROVIDER_SITE_OTHER): Payer: Medicare PPO | Admitting: Specialist

## 2016-07-31 ENCOUNTER — Ambulatory Visit (INDEPENDENT_AMBULATORY_CARE_PROVIDER_SITE_OTHER): Payer: Medicare HMO

## 2016-07-31 VITALS — BP 136/72 | HR 70 | Ht 68.0 in | Wt 160.0 lb

## 2016-07-31 DIAGNOSIS — G8929 Other chronic pain: Secondary | ICD-10-CM

## 2016-07-31 DIAGNOSIS — M48062 Spinal stenosis, lumbar region with neurogenic claudication: Secondary | ICD-10-CM

## 2016-07-31 DIAGNOSIS — M4316 Spondylolisthesis, lumbar region: Secondary | ICD-10-CM | POA: Diagnosis not present

## 2016-07-31 DIAGNOSIS — M5441 Lumbago with sciatica, right side: Secondary | ICD-10-CM | POA: Diagnosis not present

## 2016-07-31 DIAGNOSIS — G35 Multiple sclerosis: Secondary | ICD-10-CM

## 2016-07-31 DIAGNOSIS — M5136 Other intervertebral disc degeneration, lumbar region: Secondary | ICD-10-CM

## 2016-07-31 MED ORDER — MELOXICAM 15 MG PO TABS: 15.0000 mg | ORAL_TABLET | Freq: Every day | ORAL | 6 refills | Status: DC

## 2016-07-31 MED ORDER — GABAPENTIN 300 MG PO CAPS: ORAL_CAPSULE | ORAL | 3 refills | Status: DC

## 2016-07-31 NOTE — Progress Notes (Signed)
Office Visit Note   Patient: Paul Sutton           Date of Birth: [birth year]           MRN: [id number] Visit Date: 07/31/2016              Requested by: Glenda Chroman, MD [place], [place] [address] PCP: Babs Bertin, Odette Fraction., MD   Assessment & Plan: Visit Diagnoses:  1. Chronic midline low back pain with right-sided sciatica   2. Spondylolisthesis, lumbar region   3. Spinal stenosis of lumbar region with neurogenic claudication   4. Degenerative lumbar disc   5. Multiple sclerosis (HCC)     Plan:Avoid bending, stooping and avoid lifting weights greater than 10 lbs. Avoid prolong standing and walking. Avoid frequent bending and stooping  No lifting greater than 10 lbs. May use ice or moist heat for pain. Weight loss is of benefit. Handicap license is approved. Dr. Romona Curls secretary/Assistant will call to arrange for epidural steroid injection  Follow-Up Instructions: Return in about 3 weeks (around 08/21/2016).   Orders:  Orders Placed This Encounter  Procedures  . XR Lumbar Spine 2-3 Views  . Ambulatory referral to Physical Medicine Rehab   No orders of the defined types were placed in this encounter.     Procedures: No procedures performed   Clinical Data: No additional findings.   Subjective: Chief Complaint  Patient presents with  . Lower Back - Pain    66 year old male with at least 30 year history of low back pain and radiation into the right buttock and along the lateral right thigh. Pain is worse with standing and ambulation, walking a distance Even walking into the building from the parking. He has been using a cane for ambulation in the right hand. He reports a diagnosis MS made over 20 years ago in [place] by Dr.Della Jimmye Norman, He does not remember an MRI of the brain. He is followed by Dr. Woody Seller in [place] is his medical MD. He now sees another neurologist in [place], doctor at [facility], [address] [place], [place]  [address]  [phone].  He feels better sitting, recent increase in back pain central to the lumbosacral junction and right hip and buttock pain and radiation into the right lateral thigh. No bowel or bladder difficulties. No pain with coughing or sneezing. He does experience pain with repetitive bending and stooping.     Review of Systems  Constitutional: Negative.   HENT: Negative.   Eyes: Negative.   Respiratory: Negative.   Cardiovascular: Negative.   Gastrointestinal: Negative.   Endocrine: Negative.   Genitourinary: Negative.   Musculoskeletal: Negative.   Skin: Negative.   Allergic/Immunologic: Negative.   Neurological: Negative.   Hematological: Negative.   Psychiatric/Behavioral: Negative.      Objective: Vital Signs: BP 136/72 (BP Location: Left Arm, Patient Position: Sitting)   Pulse 70   Ht 5\' 8"  (1.727 m)   Wt 160 lb (72.6 kg)   BMI 24.33 kg/m   Physical Exam  Constitutional: He is oriented to person, place, and time. He appears well-developed and well-nourished.  HENT:  Head: Normocephalic and atraumatic.  Eyes: EOM are normal. Pupils are equal, round, and reactive to light.  Neck: Normal range of motion. Neck supple.  Pulmonary/Chest: Effort normal and breath sounds normal.  Abdominal: Soft. Bowel sounds are normal.  Neurological: He is alert and oriented to person, place, and time.  Skin: Skin is  warm and dry.  Psychiatric: He has a normal mood and affect. His behavior is normal. Judgment and thought content normal.    Back Exam   Tenderness  The patient is experiencing tenderness in the lumbar and cervical.  Range of Motion  Extension:  30 abnormal  Flexion: 80  Lateral Bend Right: 50  Lateral Bend Left: 50  Rotation Right: 70  Rotation Left: 70   Muscle Strength  Right Quadriceps:  5/5  Left Quadriceps:  5/5  Right Hamstrings:  5/5  Left Hamstrings:  5/5   Tests  Straight leg raise right: negative Straight leg raise left:  negative  Reflexes  Patellar:  Hyperreflexic abnormal Achilles:  Hyperreflexic abnormal Biceps:  2/4 normal Babinski's sign: abnormal   Other  Toe Walk: normal Heel Walk: normal Sensation: normal Gait: abnormal  Erythema: no back redness Scars: present  Comments:  Bilateral clonus left sustained, right 10-12 beats, Hamstring tightness      Specialty Comments:  No specialty comments available.  Imaging: Xr Lumbar Spine 2-3 Views  Result Date: 07/31/2016 AP and lateral flexion and extension radiographs show grade one spondylolisthesis L4-5, mild degenerative disc narrowing L4-5 , severe degenerative disc narrowing L5-S1 with retrolisthesis L5-S1 grade 1, end plate sclerosis F6-C1 and large anterior spurs L5-S1.    PMFS History: There are no active problems to display for this patient.  Past Medical History:  Diagnosis Date  . Arthritis    generalized   . Cancer [facility])    prostate cancer  . Chronic lower back pain last 25 to 30 years  . Hypertension   . Multiple sclerosis ([facility])   . Neuromuscular disorder ([place])    MS- [place]     No family history on file.  Past Surgical History:  Procedure Laterality Date  . NO PAST SURGERIES    . ROBOT ASSISTED LAPAROSCOPIC RADICAL PROSTATECTOMY  06/07/2011   Procedure: ROBOTIC ASSISTED LAPAROSCOPIC RADICAL PROSTATECTOMY LEVEL 2;  Surgeon: Dutch Gray, MD;  Location: [facility];  Service: Urology;  Laterality: N/A;       Social History   Occupational History  . Not on file.   Social History Main Topics  . Smoking status: Former Smoker    Packs/day: 0.25    Years: 30.00    Types: Cigarettes    Quit date: 04/12/2011  . Smokeless tobacco: Never Used  . Alcohol use No  . Drug use: No  . Sexual activity: Not on file

## 2016-07-31 NOTE — Patient Instructions (Signed)
Avoid bending, stooping and avoid lifting weights greater than 10 lbs. Avoid prolong standing and walking. Avoid frequent bending and stooping  No lifting greater than 10 lbs. May use ice or moist heat for pain. Weight loss is of benefit. Handicap license is approved. Dr. Newton's secretary/Assistant will call to arrange for epidural steroid injection  

## 2016-08-08 ENCOUNTER — Encounter (INDEPENDENT_AMBULATORY_CARE_PROVIDER_SITE_OTHER): Payer: Medicare HMO | Admitting: Physical Medicine and Rehabilitation

## 2016-08-20 ENCOUNTER — Encounter (INDEPENDENT_AMBULATORY_CARE_PROVIDER_SITE_OTHER): Payer: Self-pay | Admitting: Physical Medicine and Rehabilitation

## 2016-08-20 ENCOUNTER — Ambulatory Visit (INDEPENDENT_AMBULATORY_CARE_PROVIDER_SITE_OTHER): Payer: Self-pay

## 2016-08-20 ENCOUNTER — Ambulatory Visit (INDEPENDENT_AMBULATORY_CARE_PROVIDER_SITE_OTHER): Payer: Medicare HMO | Admitting: Physical Medicine and Rehabilitation

## 2016-08-20 VITALS — BP 132/84 | HR 90

## 2016-08-20 DIAGNOSIS — M5416 Radiculopathy, lumbar region: Secondary | ICD-10-CM | POA: Diagnosis not present

## 2016-08-20 MED ORDER — METHYLPREDNISOLONE ACETATE 80 MG/ML IJ SUSP
80.0000 mg | Freq: Once | INTRAMUSCULAR | Status: AC
  Administered 2016-08-20: 80 mg

## 2016-08-20 MED ORDER — LIDOCAINE HCL (PF) 1 % IJ SOLN
2.0000 mL | Freq: Once | INTRAMUSCULAR | Status: AC
  Administered 2016-08-20: 2 mL

## 2016-08-20 NOTE — Patient Instructions (Signed)

## 2016-08-20 NOTE — Progress Notes (Deleted)
Right side low back pain for years. Worse recently. Constant pain. Relief with sitting. Radiating down the side of right leg to knee. Denies numbness and tingling. Occasional leg weakness.

## 2016-08-21 NOTE — Procedures (Signed)
Lumbosacral Transforaminal Epidural Steroid Injection - Infraneural Approach with Fluoroscopic Guidance  Patient: Paul Sutton      Date of Birth: [birth year] MRN: [id number] PCP: Orpah Cobb., MD      Visit Date: 08/20/2016   Paul Sutton is a 66 year old gentleman from [place] who complains of chronic severe right-sided low back pain with some referral to the hip but nothing down the leg. 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 MRI evidence from [facility] shows significant facet arthropathy at L4-5 lateral recess narrowing. Dr. Louanne Skye who is been following him request a right L4-L5 transforaminal epidural steroid injection which we will do today. Looking at the fluoroscopic images the right facet joint is very arthritic I wonder how much of his pain could be facet joint mediated. He reports no numbness or tingling. He reports constant pain. He reports relief with sitting and again worsening with standing and walking. He does have a give way type weakness at times. It does radiate at least in the knee on occasion. He has felt conservative care over the years with medication management and therapy and prior epidural injections which sounded like they were blind epidurals.  Universal Protocol:     Consent Given By: the patient  Position: PRONE   Additional Comments: Vital signs were monitored before and after the procedure. Patient was prepped and draped in the usual sterile fashion. The correct patient, procedure, and site was verified.   Injection Procedure Details:  Procedure Site One Meds Administered:  Meds ordered this encounter  Medications  . lidocaine (PF) (XYLOCAINE) 1 % injection 2 mL  . methylPREDNISolone acetate (DEPO-MEDROL) injection 80 mg      Laterality: Right  Location/Site:  L4-L5 L5-S1  Needle size: 22 G  Needle type: Spinal  Needle Placement:  Transforaminal  Findings:  -Contrast Used: 1 mL iohexol 180 mg iodine/mL   -Comments: Excellent flow of contrast along the nerve and into the epidural space.  Procedure Details: After squaring off the end-plates of the desired vertebral level to get a true AP view, the C-arm was obliqued to the painful side so that the superior articulating process is positioned about 1/3 the length of the inferior endplate.  The needle was aimed toward the junction of the superior articular process and the transverse process of the inferior vertebrae. The needle's initial entry is in the lower third of the foramen through Kambin's triangle. The soft tissues overlying this target were infiltrated with 2-3 ml. of 1% Lidocaine without Epinephrine.  The spinal needle was then inserted and advanced toward the target using a "trajectory" view along the fluoroscope beam.  Under AP and lateral visualization, the needle was advanced so it did not puncture dura and did not traverse medially beyond the 6 o'clock position of the pedicle. Bi-planar projections were used to confirm position. Aspiration was confirmed to be negative for CSF and/or blood. A 1-2 ml. volume of Isovue-250 was injected and flow of contrast was noted at each level. Radiographs were obtained for documentation purposes.   After attaining the desired flow of contrast documented above, a 0.5 to 1.0 ml test dose of 0.25% Marcaine was injected into each respective transforaminal space.  The patient was observed for 90 seconds post injection.  After no sensory deficits were reported, and normal lower extremity motor function was noted,   the above injectate was administered so that equal amounts of the injectate  were placed at each foramen (level) into the transforaminal epidural space.   Additional Comments:  The patient tolerated the procedure well Dressing: Band-Aid    Post-procedure details: Patient was observed during the procedure. Post-procedure  instructions were reviewed.  Patient left the clinic in stable condition.

## 2016-08-23 ENCOUNTER — Ambulatory Visit (INDEPENDENT_AMBULATORY_CARE_PROVIDER_SITE_OTHER): Payer: Medicare HMO | Admitting: Specialist

## 2016-08-23 ENCOUNTER — Ambulatory Visit (INDEPENDENT_AMBULATORY_CARE_PROVIDER_SITE_OTHER): Payer: Self-pay | Admitting: Specialist

## 2016-08-29 ENCOUNTER — Telehealth (INDEPENDENT_AMBULATORY_CARE_PROVIDER_SITE_OTHER): Payer: Self-pay | Admitting: Specialist

## 2016-08-29 NOTE — Telephone Encounter (Signed)
Patient called advised he was told to schedule an appointment 2 to 3 wks after he saw Dr. Ernestina Patches. Patient asked to be worked into Dr [employer] schedule. Patient said he is doing pretty good right now. The number to contact patient is [phone]

## 2016-08-31 NOTE — Telephone Encounter (Signed)
Patient sched for 09/04/2016 @ 130 with Dr. Louanne Skye

## 2016-09-04 ENCOUNTER — Ambulatory Visit (INDEPENDENT_AMBULATORY_CARE_PROVIDER_SITE_OTHER): Payer: Medicare PPO | Admitting: Specialist

## 2016-09-04 ENCOUNTER — Encounter (INDEPENDENT_AMBULATORY_CARE_PROVIDER_SITE_OTHER): Payer: Self-pay | Admitting: Specialist

## 2016-09-04 VITALS — BP 126/69 | HR 70 | Ht 68.0 in | Wt 160.0 lb

## 2016-09-04 DIAGNOSIS — R252 Cramp and spasm: Secondary | ICD-10-CM

## 2016-09-04 DIAGNOSIS — G35 Multiple sclerosis: Secondary | ICD-10-CM

## 2016-09-04 DIAGNOSIS — M48062 Spinal stenosis, lumbar region with neurogenic claudication: Secondary | ICD-10-CM | POA: Diagnosis not present

## 2016-09-04 MED ORDER — TIZANIDINE HCL 2 MG PO CAPS: 2.0000 mg | ORAL_CAPSULE | Freq: Three times a day (TID) | ORAL | 1 refills | Status: DC

## 2016-09-04 NOTE — Patient Instructions (Signed)
Avoid bending, stooping and avoid lifting weights greater than 10 lbs. Avoid prolong standing and walking. Avoid frequent bending and stooping  No lifting greater than 10 lbs. May use ice or moist heat for pain. Weight loss is of benefit. Handicap license is approved. We will request eval with neurology to consider EMG/NCV to assess for spinal stenosis vs MS and whether a surgery may be of benefit. Tizanidine 2 mg every 8 hours for spasm.

## 2016-09-04 NOTE — Progress Notes (Signed)
Office Visit Note   Patient: Paul Sutton           Date of Birth: October 30, 1950           MRN: [id number] Visit Date: 09/04/2016              Requested by: Babs Bertin, Odette Fraction., MD No address on file PCP: Babs Bertin, Odette Fraction., MD   Assessment & Plan: Visit Diagnoses:  1. Multiple sclerosis, primary progressive ([place])   2. Spinal stenosis of lumbar region with neurogenic claudication   3. Spasticity     Plan: Avoid bending, stooping and avoid lifting weights greater than 10 lbs. Avoid prolong standing and walking. Avoid frequent bending and stooping  No lifting greater than 10 lbs. May use ice or moist heat for pain. Weight loss is of benefit. Handicap license is approved. We will request eval with neurology to consider EMG/NCV to assess for spinal stenosis vs MS and whether a surgery may be of benefit. Tizanidine 2 mg every 8 hours for spasm.  Follow-Up Instructions: No Follow-up on file.   Orders:  Orders Placed This Encounter  Procedures  . Ambulatory referral to Neurology   Meds ordered this encounter  Medications  . tizanidine (ZANAFLEX) 2 MG capsule    Sig: Take 1 capsule (2 mg total) by mouth 3 (three) times daily.    Dispense:  60 capsule    Refill:  1      Procedures: No procedures performed   Clinical Data: No additional findings.   Subjective: Chief Complaint  Patient presents with  . Lower Back - Follow-up    66 year old male with MS and lumbar spinal stenosis, he underwent ESIs of the lumbar spine at right L4 and L5. 6/4. Presently some improvement in the pain into his legs and standing and walking tolerance is improved.     Review of Systems  Constitutional: Negative.   HENT: Negative.   Eyes: Negative.   Respiratory: Negative.   Cardiovascular: Negative.   Gastrointestinal: Negative.   Endocrine: Negative.   Genitourinary: Negative.   Musculoskeletal: Negative.   Skin: Negative.   Allergic/Immunologic: Negative.   Neurological:  Negative.   Hematological: Negative.   Psychiatric/Behavioral: Negative.      Objective: Vital Signs: BP 126/69 (BP Location: Left Arm, Patient Position: Sitting)   Pulse 70   Ht 5\' 8"  (1.727 m)   Wt 160 lb (72.6 kg)   BMI 24.33 kg/m   Physical Exam  Constitutional: He is oriented to person, place, and time. He appears well-developed and well-nourished.  HENT:  Head: Normocephalic and atraumatic.  Eyes: EOM are normal. Pupils are equal, round, and reactive to light.  Neck: Normal range of motion. Neck supple.  Pulmonary/Chest: Effort normal and breath sounds normal.  Abdominal: Soft. Bowel sounds are normal.  Neurological: He is alert and oriented to person, place, and time.  Skin: Skin is warm and dry.  Psychiatric: He has a normal mood and affect. His behavior is normal. Judgment and thought content normal.    Back Exam   Tenderness  The patient is experiencing tenderness in the lumbar.  Range of Motion  Extension: abnormal  Flexion: abnormal  Lateral Bend Right: abnormal  Lateral Bend Left: abnormal  Rotation Right: abnormal  Rotation Left: abnormal   Muscle Strength  Right Quadriceps:  5/5  Left Quadriceps:  4/5  Right Hamstrings:  5/5  Left Hamstrings:  5/5   Reflexes  Patellar: Hyperreflexic Achilles: Hyperreflexic Babinski's sign:  normal   Other  Toe Walk: abnormal Heel Walk: abnormal Erythema: no back redness Scars: absent      Specialty Comments:  No specialty comments available.  Imaging: No results found.   PMFS History: There are no active problems to display for this patient.  Past Medical History:  Diagnosis Date  . Arthritis    generalized   . Cancer [facility])    prostate cancer  . Chronic lower back pain last 25 to 30 years  . Hypertension   . Multiple sclerosis ([place])   . Neuromuscular disorder ([place])    MS- [place]     No family history on file.  Past Surgical History:  Procedure Laterality Date  . NO PAST SURGERIES      . ROBOT ASSISTED LAPAROSCOPIC RADICAL PROSTATECTOMY  06/07/2011   Procedure: ROBOTIC ASSISTED LAPAROSCOPIC RADICAL PROSTATECTOMY LEVEL 2;  Surgeon: Dutch Gray, MD;  Location: [facility];  Service: Urology;  Laterality: N/A;       Social History   Occupational History  . Not on file.   Social History Main Topics  . Smoking status: Former Smoker    Packs/day: 0.25    Years: 30.00    Types: Cigarettes    Quit date: 04/12/2011  . Smokeless tobacco: Never Used  . Alcohol use No  . Drug use: No  . Sexual activity: Not on file

## 2016-09-05 ENCOUNTER — Telehealth (INDEPENDENT_AMBULATORY_CARE_PROVIDER_SITE_OTHER): Payer: Self-pay

## 2016-09-05 NOTE — Telephone Encounter (Signed)
Patient would like to know which would be better to take the Tizanidine capsule or the tizanidine tablet.  CB# is [phone].  Please Advise.  Thank You.

## 2016-09-06 NOTE — Telephone Encounter (Signed)
Faxed form back to pharmacy that tablets were fine for him to get.

## 2016-10-15 ENCOUNTER — Telehealth (INDEPENDENT_AMBULATORY_CARE_PROVIDER_SITE_OTHER): Payer: Self-pay

## 2016-10-15 NOTE — Telephone Encounter (Signed)
Requesting another injection. Had Rt L4-L5 L5-S1 TF on 08/20/16. Ok to repeat?

## 2016-10-15 NOTE — Telephone Encounter (Signed)
Please ask Dr Nitka about this.  

## 2016-10-15 NOTE — Telephone Encounter (Signed)
Please send to Nitka/Christy

## 2016-10-16 NOTE — Telephone Encounter (Signed)
Please advise 

## 2016-10-17 ENCOUNTER — Ambulatory Visit (INDEPENDENT_AMBULATORY_CARE_PROVIDER_SITE_OTHER): Payer: Medicare PPO | Admitting: Specialist

## 2016-10-23 ENCOUNTER — Other Ambulatory Visit (INDEPENDENT_AMBULATORY_CARE_PROVIDER_SITE_OTHER): Payer: Self-pay | Admitting: Specialist

## 2016-10-23 DIAGNOSIS — M48062 Spinal stenosis, lumbar region with neurogenic claudication: Secondary | ICD-10-CM

## 2016-10-23 NOTE — Telephone Encounter (Signed)
In the work que to be sched.

## 2016-10-23 NOTE — Telephone Encounter (Signed)
Order for ESI placed

## 2016-10-24 ENCOUNTER — Telehealth (INDEPENDENT_AMBULATORY_CARE_PROVIDER_SITE_OTHER): Payer: Self-pay | Admitting: Physical Medicine and Rehabilitation

## 2016-10-24 NOTE — Telephone Encounter (Signed)
error 

## 2016-10-31 ENCOUNTER — Telehealth (INDEPENDENT_AMBULATORY_CARE_PROVIDER_SITE_OTHER): Payer: Self-pay

## 2016-10-31 NOTE — Telephone Encounter (Signed)
Patient would like a Rx for pain for his back.  Cb# is [phone].  Please advise.  Thank You.

## 2016-11-01 NOTE — Telephone Encounter (Signed)
I called and spoke with patient, he wanted an appt on 11/07/16 with Dr. Louanne Skye or Jeneen Rinks since he was going to be here to Dr. Ernestina Patches, but they do not have any available appts that day, he wants to talk with Dr. Ernestina Patches about this at his appt.

## 2016-11-01 NOTE — Telephone Encounter (Signed)
Needs return office visit  

## 2016-11-01 NOTE — Telephone Encounter (Signed)
Can you please advise on this

## 2016-11-07 ENCOUNTER — Encounter (INDEPENDENT_AMBULATORY_CARE_PROVIDER_SITE_OTHER): Payer: Self-pay | Admitting: Physical Medicine and Rehabilitation

## 2016-11-07 ENCOUNTER — Ambulatory Visit (INDEPENDENT_AMBULATORY_CARE_PROVIDER_SITE_OTHER): Payer: Medicare PPO | Admitting: Physical Medicine and Rehabilitation

## 2016-11-07 ENCOUNTER — Ambulatory Visit (INDEPENDENT_AMBULATORY_CARE_PROVIDER_SITE_OTHER): Payer: Medicare PPO

## 2016-11-07 VITALS — BP 148/73 | HR 72

## 2016-11-07 DIAGNOSIS — M5416 Radiculopathy, lumbar region: Secondary | ICD-10-CM | POA: Diagnosis not present

## 2016-11-07 MED ORDER — TRAMADOL HCL 50 MG PO TABS: 50.0000 mg | ORAL_TABLET | Freq: Two times a day (BID) | ORAL | 0 refills | Status: DC

## 2016-11-07 MED ORDER — BETAMETHASONE SOD PHOS & ACET 6 (3-3) MG/ML IJ SUSP
12.0000 mg | Freq: Once | INTRAMUSCULAR | Status: AC
  Administered 2016-11-07: 12 mg

## 2016-11-07 MED ORDER — LIDOCAINE HCL (PF) 1 % IJ SOLN
2.0000 mL | Freq: Once | INTRAMUSCULAR | Status: AC
  Administered 2016-11-07: 2 mL

## 2016-11-07 NOTE — Patient Instructions (Signed)

## 2016-11-07 NOTE — Progress Notes (Deleted)
Pain in the center of lower back and to the right. Down right leg to knee at times. Patient states he had relief with last injection for around 3 weeks. Not pain free but more than 50 % relief.

## 2016-11-09 ENCOUNTER — Encounter (INDEPENDENT_AMBULATORY_CARE_PROVIDER_SITE_OTHER): Payer: Self-pay | Admitting: Physical Medicine and Rehabilitation

## 2016-11-09 NOTE — Procedures (Signed)
Mr. Paul Sutton is a 66 year old gentleman followed by Dr. Louanne Skye. His case is complicated by multiple sclerosis. We have completed a right L4-L5 transforaminal injection with more than 50% relief but his symptoms have returned. A repeat the injection today. Patient is asking for pain medication for a few days until the injection may take effect. I did reorder a short-term prescription for tramadol. He'll follow up with Dr. Louanne Skye.  Lumbosacral Transforaminal Epidural Steroid Injection - Sub-Pedicular Approach with Fluoroscopic Guidance  Patient: Paul Sutton      Date of Birth: May 16, 1950 MRN: [id number] PCP: Orpah Cobb., MD      Visit Date: 11/07/2016   Universal Protocol:    Date/Time: 11/07/2016  Consent Given By: the patient  Position: PRONE  Additional Comments: Vital signs were monitored before and after the procedure. Patient was prepped and draped in the usual sterile fashion. The correct patient, procedure, and site was verified.   Injection Procedure Details:  Procedure Site One Meds Administered:  Meds ordered this encounter  Medications  . lidocaine (PF) (XYLOCAINE) 1 % injection 2 mL  . betamethasone acetate-betamethasone sodium phosphate (CELESTONE) injection 12 mg  . traMADol (ULTRAM) 50 MG tablet    Sig: Take 1 tablet (50 mg total) by mouth 2 (two) times daily.    Dispense:  20 tablet    Refill:  0    Laterality: Right  Location/Site:  L4-L5 L5-S1  Needle size: 22 G  Needle type: Spinal  Needle Placement: Transforaminal  Findings:  -Contrast Used: 1 mL iohexol 180 mg iodine/mL   -Comments: Excellent flow of contrast along the nerve and into the epidural space.  Procedure Details: After squaring off the end-plates to get a true AP view, the C-arm was positioned so that an oblique view of the foramen as noted above was visualized. The target area is just inferior to the "nose of the scotty dog" or sub pedicular. The soft tissues overlying this  structure were infiltrated with 2-3 ml. of 1% Lidocaine without Epinephrine.  The spinal needle was inserted toward the target using a "trajectory" view along the fluoroscope beam.  Under AP and lateral visualization, the needle was advanced so it did not puncture dura and was located close the 6 O'Clock position of the pedical in AP tracterory. Biplanar projections were used to confirm position. Aspiration was confirmed to be negative for CSF and/or blood. A 1-2 ml. volume of Isovue-250 was injected and flow of contrast was noted at each level. Radiographs were obtained for documentation purposes.   After attaining the desired flow of contrast documented above, a 0.5 to 1.0 ml test dose of 0.25% Marcaine was injected into each respective transforaminal space.  The patient was observed for 90 seconds post injection.  After no sensory deficits were reported, and normal Sutton extremity motor function was noted,   the above injectate was administered so that equal amounts of the injectate were placed at each foramen (level) into the transforaminal epidural space.   Additional Comments:  The patient tolerated the procedure well Dressing: Band-Aid    Post-procedure details: Patient was observed during the procedure. Post-procedure instructions were reviewed.  Patient left the clinic in stable condition.

## 2017-04-17 ENCOUNTER — Ambulatory Visit (INDEPENDENT_AMBULATORY_CARE_PROVIDER_SITE_OTHER): Payer: Medicare PPO | Admitting: Specialist

## 2017-04-17 ENCOUNTER — Ambulatory Visit (INDEPENDENT_AMBULATORY_CARE_PROVIDER_SITE_OTHER): Payer: Medicare PPO

## 2017-04-17 ENCOUNTER — Encounter (INDEPENDENT_AMBULATORY_CARE_PROVIDER_SITE_OTHER): Payer: Self-pay | Admitting: Specialist

## 2017-04-17 VITALS — BP 149/69 | HR 60 | Ht 68.0 in | Wt 160.0 lb

## 2017-04-17 DIAGNOSIS — M4316 Spondylolisthesis, lumbar region: Secondary | ICD-10-CM | POA: Diagnosis not present

## 2017-04-17 DIAGNOSIS — G35 Multiple sclerosis: Secondary | ICD-10-CM | POA: Diagnosis not present

## 2017-04-17 DIAGNOSIS — M48062 Spinal stenosis, lumbar region with neurogenic claudication: Secondary | ICD-10-CM | POA: Diagnosis not present

## 2017-04-17 DIAGNOSIS — M5136 Other intervertebral disc degeneration, lumbar region: Secondary | ICD-10-CM | POA: Diagnosis not present

## 2017-04-17 MED ORDER — HYDROCODONE-ACETAMINOPHEN 5-325 MG PO TABS: 1.0000 | ORAL_TABLET | Freq: Four times a day (QID) | ORAL | 0 refills | Status: DC | PRN

## 2017-04-17 NOTE — Progress Notes (Signed)
Office Visit Note   Patient: Paul Sutton           Date of Birth: 1950/12/18           MRN: [id number] Visit Date: 04/17/2017              Requested by: Babs Bertin, Odette Fraction., MD No address on file PCP: Babs Bertin, Odette Fraction., MD   Assessment & Plan: Visit Diagnoses:  1. Spinal stenosis of lumbar region with neurogenic claudication   2. Spondylolisthesis, lumbar region   3. Degenerative disc disease, lumbar   4. Multiple sclerosis ([place])     Plan: Avoid bending, stooping and avoid lifting weights greater than 10 lbs. Avoid prolong standing and walking. Avoid frequent bending and stooping  No lifting greater than 10 lbs. May use ice or moist heat for pain. Weight loss is of benefit. Handicap license is approved. Stationary bike or pool walking are ways of exercising that may be tolerated. May wish to buy a TENs unit to decrease the pain  Follow-Up Instructions: Return in about 6 weeks (around 05/29/2017).   Orders:  Orders Placed This Encounter  Procedures  . XR Lumbar Spine 2-3 Views   Meds ordered this encounter  Medications  . HYDROcodone-acetaminophen (NORCO/VICODIN) 5-325 MG tablet    Sig: Take 1 tablet by mouth every 6 (six) hours as needed for moderate pain.    Dispense:  30 tablet    Refill:  0      Procedures: No procedures performed   Clinical Data: No additional findings.   Subjective: Chief Complaint  Patient presents with  . Lower Back - Pain    HPI  Review of Systems   Objective: Vital Signs: BP (!) 149/69 (BP Location: Left Arm, Patient Position: Sitting)   Pulse 60   Ht 5\' 8"  (1.727 m)   Wt 160 lb (72.6 kg)   BMI 24.33 kg/m   Physical Exam  Constitutional: He is oriented to person, place, and time. He appears well-developed and well-nourished.  HENT:  Head: Normocephalic and atraumatic.  Eyes: EOM are normal. Pupils are equal, round, and reactive to light.  Neck: Normal range of motion. Neck supple.  Pulmonary/Chest: Effort normal  and breath sounds normal.  Abdominal: Soft. Bowel sounds are normal.  Musculoskeletal: Normal range of motion.  Neurological: He is alert and oriented to person, place, and time.  Skin: Skin is warm and dry.  Psychiatric: He has a normal mood and affect. His behavior is normal. Judgment and thought content normal.    Ortho Exam  Specialty Comments:  No specialty comments available.  Imaging: No results found.   PMFS History: There are no active problems to display for this patient.  Past Medical History:  Diagnosis Date  . Arthritis    generalized   . Cancer [facility])    prostate cancer  . Chronic lower back pain last 25 to 30 years  . Hypertension   . Multiple sclerosis ([place])   . Neuromuscular disorder ([place])    [place]     History reviewed. No pertinent family history.  Past Surgical History:  Procedure Laterality Date  . NO PAST SURGERIES    . ROBOT ASSISTED LAPAROSCOPIC RADICAL PROSTATECTOMY  06/07/2011   Procedure: ROBOTIC ASSISTED LAPAROSCOPIC RADICAL PROSTATECTOMY LEVEL 2;  Surgeon: Dutch Gray, MD;  Location: [facility];  Service: Urology;  Laterality: N/A;       Social History   Occupational History  . Not on file  Tobacco Use  .  Smoking status: Former Smoker    Packs/day: 0.25    Years: 30.00    Pack years: 7.50    Types: Cigarettes    Last attempt to quit: 04/12/2011    Years since quitting: 6.0  . Smokeless tobacco: Never Used  Substance and Sexual Activity  . Alcohol use: No  . Drug use: No  . Sexual activity: Not on file

## 2017-04-17 NOTE — Patient Instructions (Signed)
Avoid bending, stooping and avoid lifting weights greater than 10 lbs. Avoid prolong standing and walking. Avoid frequent bending and stooping  No lifting greater than 10 lbs. May use ice or moist heat for pain. Weight loss is of benefit. Handicap license is approved. Stationary bike or pool walking are ways of exercising that may be tolerated. May wish to buy a TENs unit to decrease the pain

## 2017-05-23 ENCOUNTER — Telehealth (INDEPENDENT_AMBULATORY_CARE_PROVIDER_SITE_OTHER): Payer: Self-pay | Admitting: Specialist

## 2017-05-23 NOTE — Telephone Encounter (Signed)
Please call pt to discuss pt care.Pt wanted to speak with Paul Sutton

## 2017-05-27 ENCOUNTER — Telehealth (INDEPENDENT_AMBULATORY_CARE_PROVIDER_SITE_OTHER): Payer: Self-pay | Admitting: Specialist

## 2017-05-27 NOTE — Telephone Encounter (Signed)
Patient called asked for a call back to discuss surgery. The number to contact patient is [phone]

## 2017-05-27 NOTE — Telephone Encounter (Signed)
Please call and see if he can come tomorrow

## 2017-05-27 NOTE — Telephone Encounter (Signed)
IC spoke with patient and scheduled appointment for 05/28/17 @ 9:15

## 2017-05-28 ENCOUNTER — Encounter (INDEPENDENT_AMBULATORY_CARE_PROVIDER_SITE_OTHER): Payer: Self-pay | Admitting: Specialist

## 2017-05-28 ENCOUNTER — Ambulatory Visit (INDEPENDENT_AMBULATORY_CARE_PROVIDER_SITE_OTHER): Payer: Medicare PPO | Admitting: Specialist

## 2017-05-28 DIAGNOSIS — M4807 Spinal stenosis, lumbosacral region: Secondary | ICD-10-CM

## 2017-05-28 MED ORDER — GABAPENTIN 300 MG PO CAPS: ORAL_CAPSULE | ORAL | 3 refills | Status: DC

## 2017-05-28 MED ORDER — MELOXICAM 15 MG PO TABS: 15.0000 mg | ORAL_TABLET | Freq: Every day | ORAL | 6 refills | Status: DC

## 2017-05-28 MED ORDER — HYDROCODONE-ACETAMINOPHEN 5-325 MG PO TABS: 1.0000 | ORAL_TABLET | Freq: Four times a day (QID) | ORAL | 0 refills | Status: DC | PRN

## 2017-05-28 NOTE — Progress Notes (Signed)
Office Visit Note   Patient: Paul Sutton           Date of Birth: 1950/12/03           MRN: [id number] Visit Date: 05/28/2017              Requested by: Babs Bertin, Odette Fraction., MD No address on file PCP: Babs Bertin, Odette Fraction., MD   Assessment & Plan: Visit Diagnoses:  1. Spinal stenosis of lumbosacral region     Plan: Avoid bending, stooping and avoid lifting weights greater than 10 lbs. Avoid prolong standing and walking. Order for a new walker with wheels. Surgery scheduling secretary Kandice Hams, will call you in the next week to schedule for surgery.  Surgery recommended is a one level lumbar laminectomy L5-S1.Take hydrocodone for for pain. Risk of surgery includes risk of infection 1 in 200 patients, bleeding 1/2% chance you would need a transfusion.   Risk to the nerves is one in 10,000. Expect improved walking and standing tolerance. Expect relief of leg pain but numbness may persist depending on the length and degree of pressure that has been present.   Follow-Up Instructions: Return in about 3 weeks (around 06/18/2017).   Orders:  Orders Placed This Encounter  Procedures  . MR Lumbar Spine w/o contrast   No orders of the defined types were placed in this encounter.     Procedures: No procedures performed   Clinical Data: No additional findings.   Subjective: Chief Complaint  Patient presents with  . Lower Back - Follow-up    Wants to discuss surgery.    67 year old male with history of difficulty standing and walking a distance. He smokes 1/2 ppd and had previously stopped smoking then restarted after he lost his brother. He has been having problem with standing and walking. Has history of MS and difficulty with standing and walking. He wishes to go ahead with surgery to relieve pain associated with standing and walking relieved with sitting.     Review of Systems  Constitutional: Negative.   HENT: Negative.   Eyes: Negative.   Respiratory:  Negative.   Cardiovascular: Negative.   Gastrointestinal: Negative.   Endocrine: Negative.   Genitourinary: Negative.   Musculoskeletal: Negative.   Skin: Negative.   Allergic/Immunologic: Negative.   Neurological: Negative.   Hematological: Negative.   Psychiatric/Behavioral: Negative.      Objective: Vital Signs: Ht 5\' 8"  (1.727 m)   Wt 160 lb (72.6 kg)   BMI 24.33 kg/m   Physical Exam  Ortho Exam  Specialty Comments:  No specialty comments available.  Imaging: No results found.   PMFS History: There are no active problems to display for this patient.  Past Medical History:  Diagnosis Date  . Arthritis    generalized   . Cancer [facility])    prostate cancer  . Chronic lower back pain last 25 to 30 years  . Hypertension   . Multiple sclerosis ([place])   . Neuromuscular disorder ([place])    [place]     History reviewed. No pertinent family history.  Past Surgical History:  Procedure Laterality Date  . NO PAST SURGERIES    . ROBOT ASSISTED LAPAROSCOPIC RADICAL PROSTATECTOMY  06/07/2011   Procedure: ROBOTIC ASSISTED LAPAROSCOPIC RADICAL PROSTATECTOMY LEVEL 2;  Surgeon: Dutch Gray, MD;  Location: [facility];  Service: Urology;  Laterality: N/A;       Social History   Occupational History  . Not on file  Tobacco Use  .  Smoking status: Former Smoker    Packs/day: 0.25    Years: 30.00    Pack years: 7.50    Types: Cigarettes    Last attempt to quit: 04/12/2011    Years since quitting: 6.1  . Smokeless tobacco: Never Used  Substance and Sexual Activity  . Alcohol use: No  . Drug use: No  . Sexual activity: Not on file

## 2017-05-28 NOTE — Patient Instructions (Signed)
Avoid bending, stooping and avoid lifting weights greater than 10 lbs. Avoid prolong standing and walking. Order for a new walker with wheels. Surgery scheduling secretary Kandice Hams, will call you in the next week to schedule for surgery.  Surgery recommended is a one level lumbar laminectomy L5-S1.Take hydrocodone for for pain. Risk of surgery includes risk of infection 1 in 200 patients, bleeding 1/2% chance you would need a transfusion.   Risk to the nerves is one in 10,000. Expect improved walking and standing tolerance. Expect relief of leg pain but numbness may persist depending on the length and degree of pressure that has been present.

## 2017-06-05 ENCOUNTER — Telehealth (INDEPENDENT_AMBULATORY_CARE_PROVIDER_SITE_OTHER): Payer: Self-pay | Admitting: Specialist

## 2017-06-05 NOTE — Telephone Encounter (Signed)
I called and advised patient that he is on the cancellation list so if something opens or if we open Dr. Otho Ket schedule that we will call him and get him seen sooner.

## 2017-06-05 NOTE — Telephone Encounter (Signed)
Patient called asking for an earlier appointment, if possible. CB# [phone]

## 2017-06-17 ENCOUNTER — Ambulatory Visit (INDEPENDENT_AMBULATORY_CARE_PROVIDER_SITE_OTHER): Payer: Medicare PPO | Admitting: Specialist

## 2017-06-17 ENCOUNTER — Encounter (INDEPENDENT_AMBULATORY_CARE_PROVIDER_SITE_OTHER): Payer: Self-pay | Admitting: Specialist

## 2017-06-17 VITALS — BP 185/91 | HR 73 | Temp 97.0°F | Ht 69.0 in | Wt 160.0 lb

## 2017-06-17 DIAGNOSIS — M48062 Spinal stenosis, lumbar region with neurogenic claudication: Secondary | ICD-10-CM | POA: Diagnosis not present

## 2017-06-17 DIAGNOSIS — M5416 Radiculopathy, lumbar region: Secondary | ICD-10-CM

## 2017-06-17 NOTE — Patient Instructions (Signed)
Avoid bending, stooping and avoid lifting weights greater than 10 lbs. Avoid prolong standing and walking. Order for a new walker with wheels. Surgery scheduling secretary Kandice Hams, will call you in the next week to schedule for surgery.  Surgery recommended is a two level lumbar right partial hemilaminectomies L4-5 and  L5-S1 this would be done withmicroscopeTake hydrocodone for for pain. Risk of surgery includes risk of infection 1 in 200 patients, bleeding 1/2% chance you would need a transfusion.   Risk to the nerves is one in 10,000.  Expect improved walking and standing tolerance. Expect relief of leg pain but numbness may persist depending on the length and degree of pressure that has been present.

## 2017-06-17 NOTE — Progress Notes (Signed)
   Office Visit Note   Patient: Paul Sutton           Date of Birth: December 08, 1950           MRN: [id number] Visit Date: 06/17/2017              Requested by: Babs Bertin, Odette Fraction., MD No address on file PCP: Babs Bertin, Odette Fraction., MD   Assessment & Plan: Visit Diagnoses:  1. Spinal stenosis of lumbar region with neurogenic claudication   2. Lumbar radiculopathy     Plan: Avoid bending, stooping and avoid lifting weights greater than 10 lbs. Avoid prolong standing and walking. Order for a new walker with wheels. Surgery scheduling secretary Kandice Hams, will call you in the next week to schedule for surgery.  Surgery recommended is a two level lumbar right partial hemilaminectomies L4-5 and  L5-S1 this would be done withmicroscopeTake hydrocodone for for pain. Risk of surgery includes risk of infection 1 in 200 patients, bleeding 1/2% chance you would need a transfusion.   Risk to the nerves is one in 10,000.  Expect improved walking and standing tolerance. Expect relief of leg pain but numbness may persist depending on the length and degree of pressure that has been present.  Follow-Up Instructions: Return in about 1 month (around 07/15/2017).   Orders:  No orders of the defined types were placed in this encounter.  No orders of the defined types were placed in this encounter.     Procedures: No procedures performed   Clinical Data: No additional findings.   Subjective: Chief Complaint  Patient presents with  . Lower Back - Follow-up    HPI  Review of Systems   Objective: Vital Signs: BP (!) 185/91   Pulse 73   Temp (!) 97 F (36.1 C)   Ht 5\' 9"  (1.753 m)   Wt 160 lb (72.6 kg)   BMI 23.63 kg/m   Physical Exam  Ortho Exam  Specialty Comments:  No specialty comments available.  Imaging: No results found.   PMFS History: There are no active problems to display for this patient.  Past Medical History:  Diagnosis Date  . Arthritis    generalized    . Cancer [facility])    prostate cancer  . Chronic lower back pain last 25 to 30 years  . Hypertension   . Multiple sclerosis ([place])   . Neuromuscular disorder ([place])    [place]     History reviewed. No pertinent family history.  Past Surgical History:  Procedure Laterality Date  . NO PAST SURGERIES    . ROBOT ASSISTED LAPAROSCOPIC RADICAL PROSTATECTOMY  06/07/2011   Procedure: ROBOTIC ASSISTED LAPAROSCOPIC RADICAL PROSTATECTOMY LEVEL 2;  Surgeon: Dutch Gray, MD;  Location: [facility];  Service: Urology;  Laterality: N/A;       Social History   Occupational History  . Not on file  Tobacco Use  . Smoking status: Current Some Day Smoker    Packs/day: 0.25    Years: 30.00    Pack years: 7.50    Types: Cigarettes    Last attempt to quit: 04/12/2011    Years since quitting: 6.1  . Smokeless tobacco: Never Used  Substance and Sexual Activity  . Alcohol use: No  . Drug use: No  . Sexual activity: Not on file

## 2017-06-24 NOTE — Pre-Procedure Instructions (Signed)
Paul Sutton  06/24/2017      [address] [place] [address] Phone: [phone] Fax: [phone]  [facility], [address] AT [facility] [address] [place] [address] Phone: [phone] Fax: [phone]    Your procedure is scheduled on Monday, July 01, 2017  Report to [facility] Admitting at 5:30 A.M.  Call this number if you have problems the morning of surgery:  [phone]   Remember: Follow doctors instructions regarding Aspirin  Do not eat food or drink liquids after midnight Sunday, June 30, 2017  Take these medicines the morning of surgery with A SIP OF WATER : amLODipine (NORVASC), gabapentin (NEURONTIN), pravastatin (PRAVACHOL), traMADol (ULTRAM), if needed: baclofen (LIORESAL) for spasms, HYDROcodone for pain Stop taking vitamins, fish oil and herbal medications. Do not take any NSAIDs ie: Ibuprofen, Advil, Naproxen (Aleve), Meloxicam (Mobic), Motrin, BC and Goody Powder; stop now.  Do not wear jewelry, make-up or nail polish.  Do not wear lotions, powders, or perfumes, or deodorant.  Do not shave 48 hours prior to surgery.  Men may shave face and neck.  Do not bring valuables to the hospital.  [facility] is not responsible for any belongings or valuables.  Contacts, dentures or bridgework may not be worn into surgery.  Leave your suitcase in the car.  After surgery it may be brought to your room. For patients admitted to the hospital, discharge time will be determined by your treatment team. Patients discharged the day of surgery will not be allowed to drive home.  Special instructions: [place] - Preparing for Surgery  Before surgery, you can play an important role.  Because skin is not sterile, your skin needs to be as free of germs as possible.  You can reduce the number of germs on you skin  by washing with CHG (chlorahexidine gluconate) soap before surgery.  CHG is an antiseptic cleaner which kills germs and bonds with the skin to continue killing germs even after washing.  Please DO NOT use if you have an allergy to CHG or antibacterial soaps.  If your skin becomes reddened/irritated stop using the CHG and inform your nurse when you arrive at Short Stay.  Do not shave (including legs and underarms) for at least 48 hours prior to the first CHG shower.  You may shave your face.  Please follow these instructions carefully:   1.  Shower with CHG Soap the night before surgery and the morning of Surgery.  2.  If you choose to wash your hair, wash your hair first as usual with your normal shampoo.  3.  After you shampoo, rinse your hair and body thoroughly to remove the Shampoo.  4.  Use CHG as you would any other liquid soap.  You can apply chg directly  to the skin and wash gently with scrungie or a clean washcloth.  5.  Apply the CHG Soap to your body ONLY FROM THE NECK DOWN.  Do not use on open wounds or open sores.  Avoid contact with your eyes, ears, mouth and genitals (private parts).  Wash genitals (private parts) with your normal soap.  6.  Wash thoroughly, paying special attention to the area where your surgery will be performed.  7.  Thoroughly rinse your body with warm water from the neck down.  8.  DO NOT shower/wash with your  normal soap after using and rinsing off the CHG Soap.  9.  Pat yourself dry with a clean towel.            10.  Wear clean pajamas.            11.  Place clean sheets on your bed the night of your first shower and do not sleep with pets.  Day of Surgery  Do not apply any lotions/deoderants the morning of surgery.  Please wear clean clothes to the hospital/surgery center.  Please read over the following fact sheets that you were given. Pain Booklet, Coughing and Deep Breathing, MRSA Information and Surgical Site Infection Prevention

## 2017-06-25 ENCOUNTER — Encounter (HOSPITAL_COMMUNITY): Payer: Self-pay

## 2017-06-25 ENCOUNTER — Other Ambulatory Visit: Payer: Self-pay

## 2017-06-25 ENCOUNTER — Encounter (HOSPITAL_COMMUNITY)
Admission: RE | Admit: 2017-06-25 | Discharge: 2017-06-25 | Disposition: A | Discharge status: Home / Self Care | Payer: Medicare PPO | Source: Ambulatory Visit | Attending: Specialist | Admitting: Specialist

## 2017-06-25 DIAGNOSIS — Z01810 Encounter for preprocedural cardiovascular examination: Secondary | ICD-10-CM | POA: Insufficient documentation

## 2017-06-25 DIAGNOSIS — Z01812 Encounter for preprocedural laboratory examination: Secondary | ICD-10-CM | POA: Insufficient documentation

## 2017-06-25 DIAGNOSIS — I10 Essential (primary) hypertension: Secondary | ICD-10-CM | POA: Insufficient documentation

## 2017-06-25 HISTORY — DX: Left bundle-branch block, unspecified: I44.7

## 2017-06-25 HISTORY — DX: Radiculopathy, lumbar region: M54.16

## 2017-06-25 HISTORY — DX: Presence of dental prosthetic device (complete) (partial): Z97.2

## 2017-06-25 HISTORY — DX: Pure hypercholesterolemia, unspecified: E78.00

## 2017-06-25 HISTORY — DX: Spinal stenosis, lumbar region with neurogenic claudication: M48.062

## 2017-06-25 LAB — SURGICAL PCR SCREEN
MRSA, PCR: NEGATIVE
Staphylococcus aureus: NEGATIVE

## 2017-06-25 LAB — CBC
HCT: 46 % (ref 39.0–52.0)
Hemoglobin: 15.4 g/dL (ref 13.0–17.0)
MCH: 30.7 pg (ref 26.0–34.0)
MCHC: 33.5 g/dL (ref 30.0–36.0)
MCV: 91.8 fL (ref 78.0–100.0)
Platelets: 310 10*3/uL (ref 150–400)
RBC: 5.01 MIL/uL (ref 4.22–5.81)
RDW: 14.2 % (ref 11.5–15.5)
WBC: 4.2 10*3/uL (ref 4.0–10.5)

## 2017-06-25 LAB — BASIC METABOLIC PANEL
Anion gap: 9 (ref 5–15)
BUN: 17 mg/dL (ref 6–20)
CHLORIDE: 109 mmol/L (ref 101–111)
CO2: 22 mmol/L (ref 22–32)
Calcium: 9.1 mg/dL (ref 8.9–10.3)
Creatinine, Ser: 1.3 mg/dL — ABNORMAL HIGH (ref 0.61–1.24)
GFR calc Af Amer: >60 mL/min (ref >60)
GFR calc non Af Amer: 55 mL/min — ABNORMAL LOW (ref >60)
GLUCOSE: 91 mg/dL (ref 65–99)
POTASSIUM: 4.2 mmol/L (ref 3.5–5.1)
Sodium: 140 mmol/L (ref 135–145)

## 2017-06-25 NOTE — Progress Notes (Addendum)
Pt denies SOB, chest pain, and being under the care of a cardiologist. Pt BP was elevated today; pt stated that he did not take his BP medication this morning. Pt educated on the importance of taking his BP medications as prescribed. Pt denies having a cardiac cath and echo. Pt denies having an EKG and chest x ray within the last year. Pt denies recent labs. Pt stated that he does not take Aspirin. Pt chart forwarded to anesthesia to review EKG and clearance note on chart.

## 2017-06-26 ENCOUNTER — Encounter (HOSPITAL_COMMUNITY): Payer: Self-pay

## 2017-06-26 NOTE — Progress Notes (Signed)
Anesthesia Chart Review:  Patient is a 67 year old male scheduled for right L4-5, L5-S1 partial hemilaminectomies with right L5 foraminotomy on 07/01/2017 with Basil Dess, MD  - PCP is Jerene Bears, MD who cleared pt for surgery - Was evaluated by cardiologist Larae Grooms, MD in 2013 prior to prostatectomy.  LBBB identified on EKG, stress test ordered, results below.  Pt does not routinely see cardiology.   PMH includes: LBBB, HTN, hyperlipidemia, MS, prostate cancer (s/p prostatectomy 2013).  Former smoker (quit 06/18/17).  BMI 23.5.  Medications include: Amlodipine, pravastatin, Bactrim DS  BP (!) 171/89   Pulse 67   Temp (!) 36.4 C   Resp 20   Ht 5\' 9"  (1.753 m)   Wt 160 lb (72.6 kg)   SpO2 96%   BMI 23.63 kg/m   Preoperative labs reviewed.    EKG 06/25/17: Sinus rhythm with sinus arrhythmia with frequent PVCs. LBBB.  LBBB is present on EKG 05/04/11.   Nuclear stress test 05/17/11 (correspondence 06/09/11 in media tab):  1.  Post stress LV normal in size. 2.  Post stress LV function is normal. 3.  Unremarkable pharmacologic stress test. 4.  This is essentially a low risk scan.  Normal myocardial perfusion scan demonstrating an attenuation artifact in the inferior region of the myocardium.  No ischemia or infarct/scar is seen in the remaining myocardium.  If no changes, I anticipate pt can proceed with surgery as scheduled.   Willeen Cass, FNP-BC [facility] Short Stay Surgical Center/Anesthesiology Phone: [phone] 06/26/2017 12:54 PM

## 2017-06-27 ENCOUNTER — Ambulatory Visit (INDEPENDENT_AMBULATORY_CARE_PROVIDER_SITE_OTHER): Payer: Medicare PPO | Admitting: Surgery

## 2017-06-27 ENCOUNTER — Encounter (INDEPENDENT_AMBULATORY_CARE_PROVIDER_SITE_OTHER): Payer: Self-pay | Admitting: Surgery

## 2017-06-27 VITALS — BP 126/60 | HR 56 | Temp 97.0°F | Ht 69.0 in | Wt 160.0 lb

## 2017-06-27 DIAGNOSIS — M48062 Spinal stenosis, lumbar region with neurogenic claudication: Secondary | ICD-10-CM

## 2017-06-27 DIAGNOSIS — M5416 Radiculopathy, lumbar region: Secondary | ICD-10-CM

## 2017-06-27 NOTE — H&P (Addendum)
Paul Sutton is an 67 y.o. male.   Chief Complaint: Low back pain and right lower extremity radiculopathy HPI: 67 year old white male with history of right L4-5 and L5-S1 stenosis and the above complaint presents for preop history and physical.  Progressive worsening symptoms.  Failed conservative treatment.  Past Medical History:  Diagnosis Date  . Arthritis    generalized   . Cancer [facility])    prostate cancer  . Chronic lower back pain last 25 to 30 years  . Hypercholesterolemia   . Hypertension   . LBBB (left bundle branch block)   . Lumbar radiculopathy   . Multiple sclerosis ([place])   . Neuromuscular disorder ([place])    [place]   . Spinal stenosis of lumbar region with neurogenic claudication   . Wears partial dentures     Past Surgical History:  Procedure Laterality Date  . MULTIPLE TOOTH EXTRACTIONS    . NO PAST SURGERIES    . ROBOT ASSISTED LAPAROSCOPIC RADICAL PROSTATECTOMY  06/07/2011   Procedure: ROBOTIC ASSISTED LAPAROSCOPIC RADICAL PROSTATECTOMY LEVEL 2;  Surgeon: Dutch Gray, MD;  Location: [facility];  Service: Urology;  Laterality: N/A;        Family History  Problem Relation Age of Onset  . Hypertension Mother   . Diabetes Mother   . Hypertension Father   . Cancer Father   . Diabetes Sister   . Heart disease Brother   . Renal Disease Brother   . Heart attack Brother   . Stroke Brother    Social History:  reports that he quit smoking 9 days ago. His smoking use included cigarettes. He has a 7.50 pack-year smoking history. He has never used smokeless tobacco. He reports that he does not drink alcohol or use drugs.  Allergies: No Known Allergies  No medications prior to admission.    No results found for this or any previous visit (from the past 48 hour(s)). No results found.  Review of Systems  Constitutional: Negative.   HENT: Negative.   Respiratory: Negative.   Cardiovascular: Negative.   Gastrointestinal: Negative.   Musculoskeletal: Positive  for back pain.  Skin: Negative.   Neurological: Negative.   Psychiatric/Behavioral: Negative.     There were no vitals taken for this visit. Physical Exam  Constitutional: He is oriented to person, place, and time. He appears well-developed. No distress.  HENT:  Head: Normocephalic and atraumatic.  Eyes: Pupils are equal, round, and reactive to light. EOM are normal.  Neck: Normal range of motion.  Cardiovascular: Normal rate.  Respiratory: Effort normal. No respiratory distress. He has no wheezes.  GI: Soft. Bowel sounds are normal. He exhibits no distension. There is no tenderness.  Musculoskeletal: He exhibits tenderness.  Neurological: He is alert and oriented to person, place, and time.  Skin: Skin is warm and dry.  Psychiatric: He has a normal mood and affect.     Assessment/Plan Right L4-5 and L5-S1 stenosis  We will proceed with RIGHT L4-5 AND L5-S1 PARTIAL HEMILAMINECTOMIES WITH FORAMINOTOMY RIGHT L5 as scheduled.  Surgical procedure along with possible risks/complications and rehab/recovery time discussed.  All questions answered.  Benjiman Core, PA-C 06/27/2017, 10:51 AM

## 2017-06-27 NOTE — Progress Notes (Signed)
67 year old black male with history of right L4-5 and L5-S1 stenosis presents for preoperative history and physical today.  Full H&P was placed in the patient's chart.

## 2017-06-30 NOTE — Anesthesia Preprocedure Evaluation (Signed)
Anesthesia Evaluation  Patient identified by MRN, date of birth, ID band Patient awake    Reviewed: Allergy & Precautions, H&P , NPO status , Patient's Chart, lab work & pertinent test results  Airway Mallampati: II  TM Distance: >3 FB Neck ROM: Full    Dental no notable dental hx.    Pulmonary neg pulmonary ROS, former smoker,    Pulmonary exam normal breath sounds clear to auscultation       Cardiovascular hypertension, Pt. on medications negative cardio ROS Normal cardiovascular exam+ dysrhythmias  Rhythm:Regular Rate:Normal   EKG 06/25/17: Sinus rhythm with sinus arrhythmia with frequent PVCs. LBBB.  LBBB is present on EKG 05/04/11.   Nuclear stress test 05/17/11 (correspondence 06/09/11 in media tab):  1.  Post stress LV normal in size. 2.  Post stress LV function is normal. 3.  Unremarkable pharmacologic stress test. 4.  This is essentially a low risk scan.  Normal myocardial perfusion scan demonstrating an attenuation artifact in the inferior region of the myocardium.  No ischemia or infarct/scar is seen in the remaining myocardium.     Neuro/Psych  Neuromuscular disease (MS) negative neurological ROS  negative psych ROS   GI/Hepatic negative GI ROS, Neg liver ROS,   Endo/Other  negative endocrine ROS  Renal/GU negative Renal ROS  negative genitourinary   Musculoskeletal negative musculoskeletal ROS (+)   Abdominal   Peds negative pediatric ROS (+)  Hematology negative hematology ROS (+)   Anesthesia Other Findings   Reproductive/Obstetrics negative OB ROS                             Anesthesia Physical  Anesthesia Plan  ASA: III  Anesthesia Plan: General   Post-op Pain Management:    Induction: Intravenous  PONV Risk Score and Plan:   Airway Management Planned: Oral ETT  Additional Equipment:   Intra-op Plan:   Post-operative Plan: Extubation in OR  Informed  Consent: I have reviewed the patients History and Physical, chart, labs and discussed the procedure including the risks, benefits and alternatives for the proposed anesthesia with the patient or authorized representative who has indicated his/her understanding and acceptance.   Dental advisory given  Plan Discussed with: CRNA, Anesthesiologist and Surgeon  Anesthesia Plan Comments:         Anesthesia Quick Evaluation

## 2017-07-01 ENCOUNTER — Encounter (HOSPITAL_COMMUNITY)
Admission: AD | Disposition: A | Discharge status: Skilled Nursing Facility | Payer: Self-pay | Source: Ambulatory Visit | Attending: Specialist

## 2017-07-01 ENCOUNTER — Encounter (HOSPITAL_COMMUNITY): Payer: Self-pay | Admitting: Anesthesiology

## 2017-07-01 ENCOUNTER — Inpatient Hospital Stay (HOSPITAL_COMMUNITY)
Admission: AD | Admit: 2017-07-01 | Discharge: 2017-07-03 | DRG: 520 | Disposition: A | Discharge status: Skilled Nursing Facility | Payer: Medicare PPO | Source: Ambulatory Visit | Attending: Specialist | Admitting: Specialist

## 2017-07-01 ENCOUNTER — Ambulatory Visit (HOSPITAL_COMMUNITY): Payer: Medicare PPO | Admitting: Anesthesiology

## 2017-07-01 ENCOUNTER — Ambulatory Visit (HOSPITAL_COMMUNITY): Payer: Medicare PPO | Admitting: Emergency Medicine

## 2017-07-01 ENCOUNTER — Ambulatory Visit (HOSPITAL_COMMUNITY): Payer: Medicare PPO

## 2017-07-01 DIAGNOSIS — Z9889 Other specified postprocedural states: Secondary | ICD-10-CM

## 2017-07-01 DIAGNOSIS — Z8249 Family history of ischemic heart disease and other diseases of the circulatory system: Secondary | ICD-10-CM | POA: Diagnosis not present

## 2017-07-01 DIAGNOSIS — G8929 Other chronic pain: Secondary | ICD-10-CM | POA: Diagnosis present

## 2017-07-01 DIAGNOSIS — I10 Essential (primary) hypertension: Secondary | ICD-10-CM | POA: Diagnosis present

## 2017-07-01 DIAGNOSIS — E7800 Pure hypercholesterolemia, unspecified: Secondary | ICD-10-CM | POA: Diagnosis present

## 2017-07-01 DIAGNOSIS — Z972 Presence of dental prosthetic device (complete) (partial): Secondary | ICD-10-CM | POA: Diagnosis not present

## 2017-07-01 DIAGNOSIS — Z9079 Acquired absence of other genital organ(s): Secondary | ICD-10-CM | POA: Diagnosis not present

## 2017-07-01 DIAGNOSIS — G35 Multiple sclerosis: Secondary | ICD-10-CM | POA: Diagnosis present

## 2017-07-01 DIAGNOSIS — M48062 Spinal stenosis, lumbar region with neurogenic claudication: Principal | ICD-10-CM

## 2017-07-01 DIAGNOSIS — Z87891 Personal history of nicotine dependence: Secondary | ICD-10-CM

## 2017-07-01 DIAGNOSIS — M4807 Spinal stenosis, lumbosacral region: Secondary | ICD-10-CM | POA: Diagnosis present

## 2017-07-01 DIAGNOSIS — Z8546 Personal history of malignant neoplasm of prostate: Secondary | ICD-10-CM

## 2017-07-01 DIAGNOSIS — Z419 Encounter for procedure for purposes other than remedying health state, unspecified: Secondary | ICD-10-CM

## 2017-07-01 HISTORY — PX: LUMBAR LAMINECTOMY/DECOMPRESSION MICRODISCECTOMY: SHX5026

## 2017-07-01 LAB — APTT: aPTT: 31 seconds (ref 24–36)

## 2017-07-01 LAB — PROTIME-INR
INR: 1.07
Prothrombin Time: 13.8 seconds (ref 11.4–15.2)

## 2017-07-01 SURGERY — LUMBAR LAMINECTOMY/DECOMPRESSION MICRODISCECTOMY
Anesthesia: General

## 2017-07-01 MED ORDER — LIDOCAINE HCL (CARDIAC) 20 MG/ML IV SOLN
INTRAVENOUS | Status: DC | PRN
  Administered 2017-07-01: 20 mg via INTRAVENOUS
  Administered 2017-07-01: 80 mg via INTRAVENOUS

## 2017-07-01 MED ORDER — PHENYLEPHRINE 40 MCG/ML (10ML) SYRINGE FOR IV PUSH (FOR BLOOD PRESSURE SUPPORT)
PREFILLED_SYRINGE | INTRAVENOUS | Status: AC
  Filled 2017-07-01: qty 10

## 2017-07-01 MED ORDER — BUPIVACAINE HCL (PF) 0.5 % IJ SOLN
INTRAMUSCULAR | Status: AC
  Filled 2017-07-01: qty 30

## 2017-07-01 MED ORDER — CHLORHEXIDINE GLUCONATE 4 % EX LIQD: 60.0000 mL | Freq: Once | CUTANEOUS | Status: DC

## 2017-07-01 MED ORDER — ONDANSETRON HCL 4 MG/2ML IJ SOLN
INTRAMUSCULAR | Status: DC | PRN
  Administered 2017-07-01: 4 mg via INTRAVENOUS

## 2017-07-01 MED ORDER — ALUM & MAG HYDROXIDE-SIMETH 200-200-20 MG/5ML PO SUSP: 30.0000 mL | Freq: Four times a day (QID) | ORAL | Status: DC | PRN

## 2017-07-01 MED ORDER — POLYETHYLENE GLYCOL 3350 17 G PO PACK: 17.0000 g | PACK | Freq: Every day | ORAL | Status: DC | PRN

## 2017-07-01 MED ORDER — DEXAMETHASONE SODIUM PHOSPHATE 10 MG/ML IJ SOLN
INTRAMUSCULAR | Status: AC
  Filled 2017-07-01: qty 1

## 2017-07-01 MED ORDER — BUPIVACAINE HCL 0.5 % IJ SOLN
INTRAMUSCULAR | Status: DC | PRN
  Administered 2017-07-01: 10 mL

## 2017-07-01 MED ORDER — SODIUM CHLORIDE 0.9% FLUSH
3.0000 mL | Freq: Two times a day (BID) | INTRAVENOUS | Status: DC
  Administered 2017-07-01 – 2017-07-03 (×4): 3 mL via INTRAVENOUS

## 2017-07-01 MED ORDER — BUPIVACAINE LIPOSOME 1.3 % IJ SUSP
20.0000 mL | Freq: Once | INTRAMUSCULAR | Status: DC
  Filled 2017-07-01: qty 20

## 2017-07-01 MED ORDER — ONDANSETRON HCL 4 MG PO TABS: 4.0000 mg | ORAL_TABLET | Freq: Four times a day (QID) | ORAL | Status: DC | PRN

## 2017-07-01 MED ORDER — SODIUM CHLORIDE 0.9% FLUSH: 3.0000 mL | INTRAVENOUS | Status: DC | PRN

## 2017-07-01 MED ORDER — PHENOL 1.4 % MT LIQD: 1.0000 | OROMUCOSAL | Status: DC | PRN

## 2017-07-01 MED ORDER — PROPOFOL 10 MG/ML IV BOLUS
INTRAVENOUS | Status: DC | PRN
  Administered 2017-07-01: 150 mg via INTRAVENOUS

## 2017-07-01 MED ORDER — SODIUM CHLORIDE 0.9 % IV SOLN
250.0000 mL | INTRAVENOUS | Status: DC
  Administered 2017-07-01: 250 mL via INTRAVENOUS

## 2017-07-01 MED ORDER — HYDROCODONE-ACETAMINOPHEN 5-325 MG PO TABS: 1.0000 | ORAL_TABLET | ORAL | Status: DC | PRN

## 2017-07-01 MED ORDER — 0.9 % SODIUM CHLORIDE (POUR BTL) OPTIME
TOPICAL | Status: DC | PRN
  Administered 2017-07-01: 1000 mL

## 2017-07-01 MED ORDER — SULFAMETHOXAZOLE-TRIMETHOPRIM 400-80 MG PO TABS
1.0000 | ORAL_TABLET | Freq: Every day | ORAL | Status: DC
  Administered 2017-07-01 – 2017-07-02 (×2): 1 via ORAL
  Filled 2017-07-01 (×2): qty 1

## 2017-07-01 MED ORDER — MENTHOL 3 MG MT LOZG
1.0000 | LOZENGE | OROMUCOSAL | Status: DC | PRN
  Administered 2017-07-01: 3 mg via ORAL
  Filled 2017-07-01: qty 9

## 2017-07-01 MED ORDER — BISACODYL 5 MG PO TBEC: 5.0000 mg | DELAYED_RELEASE_TABLET | Freq: Every day | ORAL | Status: DC | PRN

## 2017-07-01 MED ORDER — ROCURONIUM BROMIDE 100 MG/10ML IV SOLN
INTRAVENOUS | Status: DC | PRN
  Administered 2017-07-01: 60 mg via INTRAVENOUS

## 2017-07-01 MED ORDER — PRAVASTATIN SODIUM 20 MG PO TABS
20.0000 mg | ORAL_TABLET | Freq: Every day | ORAL | Status: DC
  Administered 2017-07-02 – 2017-07-03 (×2): 20 mg via ORAL
  Filled 2017-07-01 (×2): qty 1

## 2017-07-01 MED ORDER — THROMBIN (RECOMBINANT) 20000 UNITS EX SOLR
CUTANEOUS | Status: AC
  Filled 2017-07-01: qty 20000

## 2017-07-01 MED ORDER — CEFAZOLIN SODIUM-DEXTROSE 2-4 GM/100ML-% IV SOLN
2.0000 g | Freq: Three times a day (TID) | INTRAVENOUS | Status: AC
  Administered 2017-07-01 (×2): 2 g via INTRAVENOUS
  Filled 2017-07-01 (×2): qty 100

## 2017-07-01 MED ORDER — MEPERIDINE HCL 50 MG/ML IJ SOLN: 6.2500 mg | INTRAMUSCULAR | Status: DC | PRN

## 2017-07-01 MED ORDER — TRAMADOL HCL 50 MG PO TABS
50.0000 mg | ORAL_TABLET | Freq: Two times a day (BID) | ORAL | Status: DC | PRN
  Administered 2017-07-02: 50 mg via ORAL
  Filled 2017-07-01: qty 1

## 2017-07-01 MED ORDER — EPHEDRINE 5 MG/ML INJ
INTRAVENOUS | Status: AC
  Filled 2017-07-01: qty 10

## 2017-07-01 MED ORDER — PHENYLEPHRINE HCL 10 MG/ML IJ SOLN
INTRAMUSCULAR | Status: DC | PRN
  Administered 2017-07-01: 30 ug/min via INTRAVENOUS

## 2017-07-01 MED ORDER — AMLODIPINE BESYLATE 10 MG PO TABS
10.0000 mg | ORAL_TABLET | Freq: Every day | ORAL | Status: DC
  Administered 2017-07-02 – 2017-07-03 (×2): 10 mg via ORAL
  Filled 2017-07-01 (×2): qty 1

## 2017-07-01 MED ORDER — ONDANSETRON HCL 4 MG/2ML IJ SOLN
INTRAMUSCULAR | Status: AC
  Filled 2017-07-01: qty 2

## 2017-07-01 MED ORDER — DOCUSATE SODIUM 100 MG PO CAPS
100.0000 mg | ORAL_CAPSULE | Freq: Two times a day (BID) | ORAL | Status: DC
  Administered 2017-07-01 – 2017-07-03 (×4): 100 mg via ORAL
  Filled 2017-07-01 (×4): qty 1

## 2017-07-01 MED ORDER — PROPOFOL 10 MG/ML IV BOLUS
INTRAVENOUS | Status: AC
  Filled 2017-07-01: qty 40

## 2017-07-01 MED ORDER — FLEET ENEMA 7-19 GM/118ML RE ENEM: 1.0000 | ENEMA | Freq: Once | RECTAL | Status: DC | PRN

## 2017-07-01 MED ORDER — FENTANYL CITRATE (PF) 100 MCG/2ML IJ SOLN
INTRAMUSCULAR | Status: DC | PRN
  Administered 2017-07-01 (×3): 50 ug via INTRAVENOUS

## 2017-07-01 MED ORDER — LIDOCAINE 2% (20 MG/ML) 5 ML SYRINGE
INTRAMUSCULAR | Status: AC
  Filled 2017-07-01: qty 5

## 2017-07-01 MED ORDER — SURGIFOAM 100 EX MISC
CUTANEOUS | Status: DC | PRN
  Administered 2017-07-01: 09:00:00 via TOPICAL

## 2017-07-01 MED ORDER — BACLOFEN 10 MG PO TABS
10.0000 mg | ORAL_TABLET | Freq: Three times a day (TID) | ORAL | Status: DC
  Administered 2017-07-01 – 2017-07-03 (×6): 10 mg via ORAL
  Filled 2017-07-01 (×7): qty 1

## 2017-07-01 MED ORDER — SUGAMMADEX SODIUM 200 MG/2ML IV SOLN
INTRAVENOUS | Status: AC
  Filled 2017-07-01: qty 2

## 2017-07-01 MED ORDER — OXYCODONE HCL 5 MG/5ML PO SOLN: 5.0000 mg | Freq: Once | ORAL | Status: DC | PRN

## 2017-07-01 MED ORDER — MELOXICAM 7.5 MG PO TABS
15.0000 mg | ORAL_TABLET | Freq: Every day | ORAL | Status: DC
  Administered 2017-07-02 – 2017-07-03 (×2): 15 mg via ORAL
  Filled 2017-07-01 (×2): qty 2

## 2017-07-01 MED ORDER — SULFAMETHOXAZOLE-TRIMETHOPRIM 800-160 MG PO TABS: 0.5000 | ORAL_TABLET | Freq: Every day | ORAL | Status: DC

## 2017-07-01 MED ORDER — PHENYLEPHRINE HCL 10 MG/ML IJ SOLN
INTRAMUSCULAR | Status: DC | PRN
  Administered 2017-07-01: 80 ug via INTRAVENOUS
  Administered 2017-07-01: 40 ug via INTRAVENOUS

## 2017-07-01 MED ORDER — MORPHINE SULFATE (PF) 2 MG/ML IV SOLN: 1.0000 mg | INTRAVENOUS | Status: DC | PRN

## 2017-07-01 MED ORDER — CEFAZOLIN SODIUM-DEXTROSE 2-4 GM/100ML-% IV SOLN
2.0000 g | INTRAVENOUS | Status: AC
  Administered 2017-07-01: 2 g via INTRAVENOUS
  Filled 2017-07-01: qty 100

## 2017-07-01 MED ORDER — EPHEDRINE SULFATE 50 MG/ML IJ SOLN
INTRAMUSCULAR | Status: DC | PRN
  Administered 2017-07-01: 10 mg via INTRAVENOUS

## 2017-07-01 MED ORDER — ACETAMINOPHEN 160 MG/5ML PO SOLN: 325.0000 mg | ORAL | Status: DC | PRN

## 2017-07-01 MED ORDER — LACTATED RINGERS IV SOLN
INTRAVENOUS | Status: DC | PRN
  Administered 2017-07-01 (×2): via INTRAVENOUS

## 2017-07-01 MED ORDER — BUPIVACAINE LIPOSOME 1.3 % IJ SUSP
INTRAMUSCULAR | Status: DC | PRN
  Administered 2017-07-01: 10 mL

## 2017-07-01 MED ORDER — HYDROCODONE-ACETAMINOPHEN 10-325 MG PO TABS
2.0000 | ORAL_TABLET | ORAL | Status: DC | PRN
  Administered 2017-07-01: 2 via ORAL
  Filled 2017-07-01: qty 2

## 2017-07-01 MED ORDER — ONDANSETRON HCL 4 MG/2ML IJ SOLN: 4.0000 mg | Freq: Once | INTRAMUSCULAR | Status: DC | PRN

## 2017-07-01 MED ORDER — TIZANIDINE HCL 2 MG PO TABS
2.0000 mg | ORAL_TABLET | Freq: Three times a day (TID) | ORAL | Status: DC
  Administered 2017-07-01 – 2017-07-03 (×6): 2 mg via ORAL
  Filled 2017-07-01 (×7): qty 1

## 2017-07-01 MED ORDER — SODIUM CHLORIDE 0.9 % IV SOLN: INTRAVENOUS | Status: DC

## 2017-07-01 MED ORDER — ONDANSETRON HCL 4 MG/2ML IJ SOLN
4.0000 mg | Freq: Four times a day (QID) | INTRAMUSCULAR | Status: DC | PRN
  Administered 2017-07-02: 4 mg via INTRAVENOUS
  Filled 2017-07-01: qty 2

## 2017-07-01 MED ORDER — GABAPENTIN 300 MG PO CAPS
300.0000 mg | ORAL_CAPSULE | Freq: Three times a day (TID) | ORAL | Status: DC
  Administered 2017-07-01 – 2017-07-03 (×6): 300 mg via ORAL
  Filled 2017-07-01 (×6): qty 1

## 2017-07-01 MED ORDER — OXYCODONE HCL 5 MG PO TABS: 5.0000 mg | ORAL_TABLET | Freq: Once | ORAL | Status: DC | PRN

## 2017-07-01 MED ORDER — FENTANYL CITRATE (PF) 100 MCG/2ML IJ SOLN: 25.0000 ug | INTRAMUSCULAR | Status: DC | PRN

## 2017-07-01 MED ORDER — ROCURONIUM BROMIDE 10 MG/ML (PF) SYRINGE
PREFILLED_SYRINGE | INTRAVENOUS | Status: AC
  Filled 2017-07-01: qty 5

## 2017-07-01 MED ORDER — SUGAMMADEX SODIUM 200 MG/2ML IV SOLN
INTRAVENOUS | Status: DC | PRN
  Administered 2017-07-01: 150 mg via INTRAVENOUS

## 2017-07-01 MED ORDER — FENTANYL CITRATE (PF) 250 MCG/5ML IJ SOLN
INTRAMUSCULAR | Status: AC
  Filled 2017-07-01: qty 5

## 2017-07-01 MED ORDER — ACETAMINOPHEN 325 MG PO TABS: 325.0000 mg | ORAL_TABLET | ORAL | Status: DC | PRN

## 2017-07-01 MED ORDER — MIDAZOLAM HCL 2 MG/2ML IJ SOLN
INTRAMUSCULAR | Status: AC
  Filled 2017-07-01: qty 2

## 2017-07-01 SURGICAL SUPPLY — 55 items
BENZOIN TINCTURE PRP APPL 2/3 (GAUZE/BANDAGES/DRESSINGS) ×3 IMPLANT
BUR SABER RD CUTTING 3.0 (BURR) ×2 IMPLANT
BUR SABER RD CUTTING 3.0MM (BURR) ×1
CANISTER SUCT 3000ML PPV (MISCELLANEOUS) ×3 IMPLANT
CLOSURE WOUND 1/2 X4 (GAUZE/BANDAGES/DRESSINGS) ×1
COVER SURGICAL LIGHT HANDLE (MISCELLANEOUS) ×3 IMPLANT
DERMABOND ADVANCED (GAUZE/BANDAGES/DRESSINGS) ×2
DERMABOND ADVANCED .7 DNX12 (GAUZE/BANDAGES/DRESSINGS) ×1 IMPLANT
DRAPE HALF SHEET 40X57 (DRAPES) IMPLANT
DRAPE INCISE IOBAN 66X45 STRL (DRAPES) IMPLANT
DRAPE MICROSCOPE LEICA (MISCELLANEOUS) ×3 IMPLANT
DRAPE SURG 17X23 STRL (DRAPES) ×12 IMPLANT
DRSG MEPILEX BORDER 4X4 (GAUZE/BANDAGES/DRESSINGS) ×3 IMPLANT
DRSG MEPILEX BORDER 4X8 (GAUZE/BANDAGES/DRESSINGS) IMPLANT
DURAPREP 26ML APPLICATOR (WOUND CARE) ×3 IMPLANT
ELECT REM PT RETURN 9FT ADLT (ELECTROSURGICAL) ×3
ELECTRODE REM PT RTRN 9FT ADLT (ELECTROSURGICAL) ×1 IMPLANT
EVACUATOR 1/8 PVC DRAIN (DRAIN) IMPLANT
GLOVE BIOGEL PI IND STRL 6.5 (GLOVE) ×1 IMPLANT
GLOVE BIOGEL PI IND STRL 7.5 (GLOVE) ×1 IMPLANT
GLOVE BIOGEL PI IND STRL 8 (GLOVE) ×1 IMPLANT
GLOVE BIOGEL PI INDICATOR 6.5 (GLOVE) ×2
GLOVE BIOGEL PI INDICATOR 7.5 (GLOVE) ×2
GLOVE BIOGEL PI INDICATOR 8 (GLOVE) ×2
GLOVE ECLIPSE 9.0 STRL (GLOVE) ×3 IMPLANT
GLOVE ORTHO TXT STRL SZ7.5 (GLOVE) ×3 IMPLANT
GLOVE SURG 8.5 LATEX PF (GLOVE) ×3 IMPLANT
GLOVE SURG SS PI 6.5 STRL IVOR (GLOVE) ×3 IMPLANT
GOWN STRL REUS W/ TWL LRG LVL3 (GOWN DISPOSABLE) ×1 IMPLANT
GOWN STRL REUS W/TWL 2XL LVL3 (GOWN DISPOSABLE) ×6 IMPLANT
GOWN STRL REUS W/TWL LRG LVL3 (GOWN DISPOSABLE) ×2
KIT BASIN OR (CUSTOM PROCEDURE TRAY) ×3 IMPLANT
KIT TURNOVER KIT B (KITS) ×3 IMPLANT
NEEDLE SPNL 18GX3.5 QUINCKE PK (NEEDLE) ×6 IMPLANT
NS IRRIG 1000ML POUR BTL (IV SOLUTION) ×3 IMPLANT
PACK LAMINECTOMY ORTHO (CUSTOM PROCEDURE TRAY) ×3 IMPLANT
PAD ARMBOARD 7.5X6 YLW CONV (MISCELLANEOUS) ×6 IMPLANT
PATTIES SURGICAL .5 X.5 (GAUZE/BANDAGES/DRESSINGS) IMPLANT
PATTIES SURGICAL .75X.75 (GAUZE/BANDAGES/DRESSINGS) IMPLANT
PATTIES SURGICAL 1X1 (DISPOSABLE) IMPLANT
SPONGE LAP 4X18 X RAY DECT (DISPOSABLE) IMPLANT
SPONGE SURGIFOAM ABS GEL 100 (HEMOSTASIS) IMPLANT
STRIP CLOSURE SKIN 1/2X4 (GAUZE/BANDAGES/DRESSINGS) ×2 IMPLANT
SUT VIC AB 0 CT1 27 (SUTURE)
SUT VIC AB 0 CT1 27XBRD ANBCTR (SUTURE) IMPLANT
SUT VIC AB 1 CT1 27 (SUTURE)
SUT VIC AB 1 CT1 27XBRD ANBCTR (SUTURE) IMPLANT
SUT VIC AB 2-0 CT1 27 (SUTURE)
SUT VIC AB 2-0 CT1 TAPERPNT 27 (SUTURE) IMPLANT
SUT VIC AB 3-0 X1 27 (SUTURE) ×3 IMPLANT
SUT VICRYL 0 UR6 27IN ABS (SUTURE) ×3 IMPLANT
TOWEL GREEN STERILE (TOWEL DISPOSABLE) ×3 IMPLANT
TOWEL GREEN STERILE FF (TOWEL DISPOSABLE) ×3 IMPLANT
TRAY FOLEY W/METER SILVER 16FR (SET/KITS/TRAYS/PACK) IMPLANT
WATER STERILE IRR 1000ML POUR (IV SOLUTION) ×3 IMPLANT

## 2017-07-01 NOTE — Op Note (Addendum)
07/01/2017  9:58 AM  PATIENT:  Paul Sutton  67 y.o. male  MRN: [id number]  OPERATIVE REPORT  PRE-OPERATIVE DIAGNOSIS:  lumbar spinal stenosis right lateral recess L4-5, foraminal stenosis right L5-S1  POST-OPERATIVE DIAGNOSIS:  lumbar spinal stenosis right lateral recess L4-5, foraminal   PROCEDURE:  Procedure(s): RIGHT L4-5 AND L5-S1 PARTIAL HEMILAMINECTOMIES WITH FORAMINOTOMY RIGHT L5    SURGEON:  Jessy Oto, MD     ASSISTANT:  Benjiman Core, PA-C  (Present throughout the entire procedure and necessary for completion of procedure in a timely manner)     ANESTHESIA:  General,supplemented with local anesthetic, Dr. Ambrose Pancoast.    COMPLICATIONS:  None.  EBL: 100CC  DRAINS: Foley to SD.      PROCEDURE:The patient was met in the holding area, and the appropriate right Lumbar level L5-S1 and L4-5 identified and marked with "x" and my initials.The patient was then transported to OR and was placed under general anesthesia without difficulty. The patient received appropriate preoperative antibiotic prophylaxis. Foley catheter was placed sterilely. The patient after intubation atraumatically was transferred to the operating room table, prone position, Wilson frame, sliding OR table. All pressure points were well padded. The arms in 90-90 well-padded at the elbows. Standard prep with DuraPrep solution lower dorsal spine to the mid sacral segment. Draped in the usual manner iodine Vi-Drape was used. Time-out procedure was called and correct. 2x 18-gauge spinal needle was then inserted at the expected L4 level. C-arm was draped sterilely to the field and used to identify the spinal needles positions. The upper needle was at the lower aspect of the lamina of L4 Skin inferior to this was then infiltrated with Marcaine half percent with 1:1 Exparel total of 30 cc used. An incision approximately an inch inch and a half in length was then made through skin and subcutaneous layers in line with the right  side of the expected midline just superior to the spinal needle entry point. An incision made into the right lumbosacral fascia approximately 3 inchs in length .  Cobb elevator was then introduced into the incision site and used to carefully form subperiosteal movement of the right paralumbar muscles off of the posterior lamina of the expected L4 to S1 levels. The depth measured off of the Cobb elevators at about 50 mm and 50 mm retractors and placed on the scaffolding for the [facility] equipment and guided down to and docking on the posterior aspect of the lamina at the expected L4-5 and L5-S1 level. The operating room microscope sterilely draped brought into the field. Under the operating room microscope, the L5-S1 and L4-5 interspaces carefully debrided the small amount of muscle attachment here and high-speed bur used to drill the medial aspect of the inferior articular process of L4 and L5 approximately 20%. A localization lateral radiograph view was obtained with kocher at the L4-5 inter spinous process. 2 mm Kerrison then used to enter the spinal canal over the superior aspect of the S1 lamina carefully using the Kerrison to debris the attachment as a curet. Foraminotomy was then performed over the S1 nerve root. The medial 20% superior articular process of S1 and then resected using an osteotome and 2 mm Kerrison. This allowed for identification of the thecal sac. Penfield 4 was then used to carefully mobilize the thecal sac medially and the S1 nerve root identified within the lateral recess flattened over the posterior aspect of the herniated disc. Carefully the lateral aspect of the S1 nerve root was identified  and a Penfield 4 was used to mobilize the nerve medially such that the L5-S1 disc was visible with microscope. No disc herniation was present onlly spurs off the posterior disc space L5-S1.  Further foraminotomies was performed over the L5 nerve root the nerve root was noted to be  decompressed. The nerve root able to be retracted along the medial aspect of the S1 pedicle the S1 root noted to be well decompressed. Ligamentum flavum was further debrided superiorly to the level L5-S1 disc. Had a moderate amount of further resection of the L5 lamina inferiorly was performed. With this then the disc space at L5-S1 was easily visualized. Ligamentum flavum was debrided and lateral recess along the medial aspect L5-S1 facet no further decompression was necessary. Ball tip nerve probe was then able to carefully palpate the neuroforamen for L5 and S1 finding these to be well decompressed. Attention then turned to the right L4-5 level which was easily visualized with the microscope. Soft tissues debrided about the posterior aspect of the L4-5 interspace. High-speed bur and then used to carefully drill inferior 3 or 4 mm of the right side L4 lamina and on the medial aspect of the right L4 inferior articular process of 3 mm. The superior margin of the L5 lamina then carefully debrided with curette and a 2 mm kerrison then used to enter the spinal canal over the superior aspect of the L5 lamina resecting bone over the superior aspect and freeing up the attachment of ligamentum flavum here. Ligamentum flavum then debrided with the 2 mm and 3 mm Kerrisons we decompressed the L5 nerve root and the lateral recess right L4-5 decompressed using 2 and 3 mm Kerrisons sizing hypertrophic reflected ligamentum flavum extending superiorly. From was resected off the ventral aspect of the inferior margin of the L4 lamina. Hockey-stick nerve probe could then be passed out the L4 neuroforamen and the L4 neuroforamen. Venous bleeding encountered. Thrombin-soaked Gelfoam used to control this following this then the sac and the L5 nerve root were mobilized medially and the L4-5 disc examined and found not to be herniated. Irrigation was carried out down to this bleeding controlled with Gelfoam. Gelfoam was then removed.  Irrigation carried careful examination demonstrated no active bleeding present. Retractors were then carefully removed Since carefully then the Bleeding was then controlled using thrombin-soaked Gelfoam small cottonoids. Small amount of bleeding within the soft tissue mass the laminotomy area was controlled using bipolar electrocautery. Irrigation was carried out using copious amounts of irrigant solution. All Gelfoam were then removed. No significant active bleeding present at the time of removal. All instruments sponge counts were correct traction system was then carefully removed carefully rotating retractors with this withdrawal and only bipolar electrocautery of any small bleeders. Lumbodorsal fascia was then carefully approximated with interrupted 0 Vicryl sutures, UR 6 needle deep subcutaneous layers were approximated with interrupted 0 Vicryl sutures on UR 6 the appear subcutaneous layers approximated with interrupted 2-0 Vicryl sutures and the skin closed with a running subcutaneous stitch of 4-0 Vicryl. Dermabond was applied allowed to dry and then Mepilex bandage applied. Patient was then carefully returned to supine position on a stretcher, reactivated and extubated. He was then returned to recovery room in satisfactory condition.  Benjiman Core, PA-C perform the duties of assistant surgeon during this case. he was present from the beginning of the case to the end of the case assisting in transfer the patient from his stretcher to the OR table and back to the stretcher at  the end of the case. Assisted in careful retraction and suction of the laminectomy site delicate neural structures operating under the operating room microscope. He performed closure of the incision from the fascia to the skin applying the dressing.     Basil Dess  07/01/2017, 9:58 AM

## 2017-07-01 NOTE — Plan of Care (Signed)

## 2017-07-01 NOTE — Discharge Instructions (Addendum)
° ° °  No lifting greater than 10 lbs. Avoid bending, stooping and twisting. Walk in house for first week them may start to get out slowly increasing distance up to one block by 3 weeks post op. Keep incision dry for 3 days, may use tegaderm or similar water impervious dressing.  Skilled nursing facility RN to perform daily wound checks and dressing changes.

## 2017-07-01 NOTE — Brief Op Note (Signed)
07/01/2017  9:40 AM  PATIENT:  Paul Sutton  67 y.o. male  PRE-OPERATIVE DIAGNOSIS:  lumbar spinal stenosis right lateral recess L4-5, foraminal stenosis right L5-S1  POST-OPERATIVE DIAGNOSIS:  lumbar spinal stenosis right lateral recess L4-5, foraminal   PROCEDURE:  Procedure(s): RIGHT L4-5 AND L5-S1 PARTIAL HEMILAMINECTOMIES WITH FORAMINOTOMY RIGHT L5 (N/A)  SURGEON:  Surgeon(s) and Role:    * Jessy Oto, MD - Primary  PHYSICIAN ASSISTANT: Esaw Grandchild  ANESTHESIA:   local and general, Dr. Ambrose Pancoast.  EBL: 100cc  BLOOD ADMINISTERED:none  DRAINS: Urinary Catheter (Foley)   LOCAL MEDICATIONS USED:  MARCAINE 0.25% 1:1 EXPAREL 1.3% Amount: 30 ml  SPECIMEN:  No Specimen  DISPOSITION OF SPECIMEN:  N/A  COUNTS:  YES  TOURNIQUET:  * No tourniquets in log *  DICTATION: .Dragon Dictation  PLAN OF CARE: Admit for overnight observation  PATIENT DISPOSITION:  PACU - hemodynamically stable.   Delay start of Pharmacological VTE agent (>24hrs) due to surgical blood loss or risk of bleeding: yes

## 2017-07-01 NOTE — Interval H&P Note (Signed)
History and Physical Interval Note:  07/01/2017 7:29 AM  Paul Sutton  has presented today for surgery, with the diagnosis of lumbar spinal stenosis right lateral recess L4-5, foraminal stenosis right L5-S1  The various methods of treatment have been discussed with the patient and family. After consideration of risks, benefits and other options for treatment, the patient has consented to  Procedure(s): RIGHT L4-5 AND L5-S1 PARTIAL HEMILAMINECTOMIES WITH FORAMINOTOMY RIGHT L5 (N/A) as a surgical intervention .  The patient's history has been reviewed, patient examined, no change in status, stable for surgery.  I have reviewed the patient's chart and labs.  Questions were answered to the patient's satisfaction.     Basil Dess

## 2017-07-01 NOTE — Transfer of Care (Signed)
Immediate Anesthesia Transfer of Care Note  Patient: Paul Sutton  Procedure(s) Performed: RIGHT L4-5 AND L5-S1 PARTIAL HEMILAMINECTOMIES WITH FORAMINOTOMY RIGHT L5 (N/A )  Patient Location: PACU  Anesthesia Type:General  Level of Consciousness: awake, alert , oriented and sedated  Airway & Oxygen Therapy: Patient Spontanous Breathing and Patient connected to nasal cannula oxygen  Post-op Assessment: Report given to RN, Post -op Vital signs reviewed and stable and Patient moving all extremities  Post vital signs: Reviewed and stable  Last Vitals:  Vitals Value Taken Time  BP    Temp    Pulse 71 07/01/2017 10:19 AM  Resp 16 07/01/2017 10:19 AM  SpO2 100 % 07/01/2017 10:19 AM  Vitals shown include unvalidated device data.  Last Pain:  Vitals:   07/01/17 0631  TempSrc:   PainSc: 0-No pain      Patients Stated Pain Goal: 2 (13/24/40 1027)  Complications: No apparent anesthesia complications

## 2017-07-01 NOTE — Anesthesia Postprocedure Evaluation (Signed)
Anesthesia Post Note  Patient: Irl Bodie Linsey  Procedure(s) Performed: RIGHT L4-5 AND L5-S1 PARTIAL HEMILAMINECTOMIES WITH FORAMINOTOMY RIGHT L5 (N/A )     Patient location during evaluation: PACU Anesthesia Type: General Level of consciousness: awake and alert Pain management: pain level controlled Vital Signs Assessment: post-procedure vital signs reviewed and stable Respiratory status: spontaneous breathing, nonlabored ventilation, respiratory function stable and patient connected to nasal cannula oxygen Cardiovascular status: blood pressure returned to baseline and stable Postop Assessment: no apparent nausea or vomiting Anesthetic complications: no    Last Vitals:  Vitals:   07/01/17 1150 07/01/17 1205  BP: (!) 151/68 (!) 147/77  Pulse: 64 70  Resp: 18 15  Temp:    SpO2: 97% 97%    Last Pain:  Vitals:   07/01/17 1205  TempSrc:   PainSc: 3                  Naileah Karg

## 2017-07-01 NOTE — Anesthesia Procedure Notes (Signed)
Procedure Name: Intubation Date/Time: 07/01/2017 7:43 AM Performed by: Scheryl Darter, CRNA Pre-anesthesia Checklist: Patient identified, Emergency Drugs available, Suction available and Patient being monitored Patient Re-evaluated:Patient Re-evaluated prior to induction Oxygen Delivery Method: Circle System Utilized Preoxygenation: Pre-oxygenation with 100% oxygen Induction Type: IV induction Ventilation: Mask ventilation without difficulty Laryngoscope Size: Miller and 3 Grade View: Grade II Tube type: Oral Tube size: 7.5 mm Number of attempts: 1 Airway Equipment and Method: Stylet and Oral airway Placement Confirmation: ETT inserted through vocal cords under direct vision,  positive ETCO2 and breath sounds checked- equal and bilateral Secured at: 23 cm Tube secured with: Tape Dental Injury: Teeth and Oropharynx as per pre-operative assessment  Comments: Anterior larynx

## 2017-07-02 ENCOUNTER — Encounter (HOSPITAL_COMMUNITY): Payer: Self-pay | Admitting: Specialist

## 2017-07-02 NOTE — Clinical Social Work Note (Signed)
Clinical Social Work Assessment  Patient Details  Name: Paul Sutton MRN: [id number] Date of Birth: 1950-04-22  Date of referral:  07/02/17               Reason for consult:  Facility Placement                Permission sought to share information with:  Chartered certified accountant granted to share information::  Yes, Verbal Permission Granted  Name::     Paul Sutton::  SNF  Relationship::     Contact Information:     Housing/Transportation Living arrangements for the past 2 months:  [place] of Information:  Patient Patient Interpreter Needed:  None Criminal Activity/Legal Involvement Pertinent to Current Situation/Hospitalization:  No - Comment as needed Significant Relationships:  Siblings, Other Family Members Lives with:  Self Do you feel safe going back to the place where you live?  No Need for family participation in patient care:  Yes (Comment)  Care giving concerns:  Pt from home and will need rehab before returning home.  Social Worker assessment / plan:  CSW met with patient at bedside to discuss SNF placement. Pt agreeable to SNF placement and indicated that he resides alone and does not have help at home. He indicated that he is interested in [facility]. CSW will disposition.  Employment status:  Retired Nurse, adult PT Recommendations:  [place] / Referral to community resources:  [place]  Patient/Family's Response to care:  Pt appreciative of CSW assistance with disposition and is agreeable to SNF.  Patient/Family's Understanding of and Emotional Response to Diagnosis, Current Treatment, and Prognosis:  Pt has good understanding of his diagnosis and care plan and is agreeable to SNF at discharge. Pt's plan is to return home once rehabilitative treatment is complete. Pt has good support system at home of sister and other family  members. CSW will f/u for disposition.  Emotional Assessment Appearance:  Appears stated age Attitude/Demeanor/Rapport:  (Cooperative) Affect (typically observed):  Accepting, Appropriate Orientation:  Oriented to Situation, Oriented to  Time, Oriented to Place, Oriented to Self Alcohol / Substance use:  Not Applicable Psych involvement (Current and /or in the community):  No (Comment)  Discharge Needs  Concerns to be addressed:  Discharge Planning Concerns Readmission within the last 30 days:  No Current discharge risk:  Dependent with Mobility, Physical Impairment Barriers to Discharge:  No Barriers Identified   Paul Baxter, LCSW 07/02/2017, 4:37 PM

## 2017-07-02 NOTE — Progress Notes (Signed)
     Subjective: 1 Day Post-Op Procedure(s) (LRB): RIGHT L4-5 AND L5-S1 PARTIAL HEMILAMINECTOMIES WITH FORAMINOTOMY RIGHT L5 (N/A)Awake, alert and oriented x 4. Foley still in place. Did not walk yesterday  Patient reports pain as moderate.    Objective:   VITALS:  Temp:  [97.6 F (36.4 C)-98.3 F (36.8 C)] 97.6 F (36.4 C) (04/16 0503) Pulse Rate:  [60-82] 65 (04/16 0503) Resp:  [5-21] 16 (04/15 1534) BP: (133-179)/(41-125) 141/69 (04/16 0503) SpO2:  [96 %-100 %] 96 % (04/16 0503)  Neurologically intact ABD soft Neurovascular intact Sensation intact distally Intact pulses distally Dorsiflexion/Plantar flexion intact Incision: scant drainage   LABS No results for input(s): HGB, WBC, PLT in the last 72 hours. No results for input(s): NA, K, CL, CO2, BUN, CREATININE, GLUCOSE in the last 72 hours. Recent Labs    07/01/17 0559  INR 1.07     Assessment/Plan: 1 Day Post-Op Procedure(s) (LRB): RIGHT L4-5 AND L5-S1 PARTIAL HEMILAMINECTOMIES WITH FORAMINOTOMY RIGHT L5 (N/A)  Advance diet Up with therapy D/C IV fluids  Basil Dess 07/02/2017, 7:45 AMPatient ID: Paul Sutton, male   DOB: [birth year], 67 y.o.   MRN: [id number]

## 2017-07-02 NOTE — Plan of Care (Signed)

## 2017-07-02 NOTE — Evaluation (Signed)
Physical Therapy Evaluation Patient Details Name: Paul Sutton MRN: [id number] DOB: 17-Dec-1950 Today's Date: 07/02/2017   History of Present Illness  Pt is a 67 y/o male now s/p R L4-5 and L5-S1 partial hemilaminectomies with foraminotomy R L5 on 07/01/17; PMHx includes MS, HTN, hypercholesterolemia, left bundle branch block   Clinical Impression  Pt admitted with above diagnosis. Pt currently with functional limitations due to the deficits listed below (see PT Problem List). At the time of PT eval pt was able to perform transfers and ambulation with gross min guard assist to min assist for balance support and safety with the RW. Pt will primarily be at home alone, and at this time feel he would be safest with 24 hour support initially. Due to other functional limitations 2 MS, feel the added PT at the SNF level rehab would be beneficial. Acutely, pt will benefit from skilled PT to increase their independence and safety with mobility to allow discharge to the venue listed below.       Follow Up Recommendations SNF;Supervision/Assistance - 24 hour    Equipment Recommendations  Rolling walker with 5" wheels;3in1 (PT)    Recommendations for Other Services       Precautions / Restrictions Precautions Precautions: Back;Fall Precaution Booklet Issued: Yes (comment) Precaution Comments: Reviewed precautions with pt and family. Pt was cued for maintenance of precautions during functional mobility.  Required Braces or Orthoses: ("No brace needed" order) Restrictions Weight Bearing Restrictions: No      Mobility  Bed Mobility Overal bed mobility: Needs Assistance Bed Mobility: Rolling;Sidelying to Sit Rolling: Min assist Sidelying to sit: Min assist       General bed mobility comments: Pt received sitting up in recliner.   Transfers Overall transfer level: Needs assistance Equipment used: Rolling walker (2 wheeled) Transfers: Sit to/from Stand Sit to Stand: Min assist          General transfer comment: Assist to power-up into full standing position. VC's for hand placement on seated surface for safety.   Ambulation/Gait Ambulation/Gait assistance: Min guard Ambulation Distance (Feet): 200 Feet(100', seated rest, 100') Assistive device: Rolling walker (2 wheeled) Gait Pattern/deviations: Step-through pattern;Decreased stride length;Trunk flexed Gait velocity: Decreased Gait velocity interpretation: <1.8 ft/sec, indicate of risk for recurrent falls General Gait Details: VC's for improved posture and closer walker proximity throughout gait training. Pt was able to make corrective changes but unable to maintain.   Stairs            Wheelchair Mobility    Modified Rankin (Stroke Patients Only)       Balance Overall balance assessment: Needs assistance Sitting-balance support: Feet supported Sitting balance-Leahy Scale: Fair     Standing balance support: Bilateral upper extremity supported Standing balance-Leahy Scale: Poor Standing balance comment: reliant on UE support                              Pertinent Vitals/Pain Pain Assessment: Faces Pain Score: 6  Faces Pain Scale: Hurts little more Pain Location: Back Pain Descriptors / Indicators: Operative site guarding;Sore Pain Intervention(s): Limited activity within patient's tolerance;Monitored during session;Repositioned    Home Living Family/patient expects to be discharged to:: Skilled nursing facility Living Arrangements: Alone Available Help at Discharge: Family;Available PRN/intermittently Type of Home: Mobile home Home Access: Stairs to enter Entrance Stairs-Rails: Right;Left;Can reach both Entrance Stairs-Number of Steps: 2 Home Layout: One level Home Equipment: Cane - single point;Walker - 4 wheels;Shower seat;Adaptive equipment  Prior Function Level of Independence: Independent with assistive device(s)         Comments: using SPC     Hand Dominance         Extremity/Trunk Assessment   Upper Extremity Assessment Upper Extremity Assessment: Overall WFL for tasks assessed    Lower Extremity Assessment Lower Extremity Assessment: Generalized weakness;RLE deficits/detail;LLE deficits/detail RLE Deficits / Details: Noted significant pronation of B feet and generally flexed knees in standing - likely due to MS. Pt states they have "been like that for a while".     Cervical / Trunk Assessment Cervical / Trunk Assessment: Other exceptions Cervical / Trunk Exceptions: s/p lumbar surgery   Communication   Communication: No difficulties  Cognition Arousal/Alertness: Awake/alert Behavior During Therapy: WFL for tasks assessed/performed Overall Cognitive Status: Within Functional Limits for tasks assessed                                        General Comments General comments (skin integrity, edema, etc.): Pt's sister and brother-in-law, daughter, and other visitor present during session.     Exercises     Assessment/Plan    PT Assessment Patient needs continued PT services  PT Problem List Decreased strength;Decreased range of motion;Decreased activity tolerance;Decreased balance;Decreased mobility;Decreased knowledge of use of DME;Decreased safety awareness;Decreased knowledge of precautions;Pain       PT Treatment Interventions DME instruction;Gait training;Stair training;Functional mobility training;Therapeutic activities;Therapeutic exercise;Neuromuscular re-education;Patient/family education    PT Goals (Current goals can be found in the Care Plan section)  Acute Rehab PT Goals Patient Stated Goal: less pain, regain independence  PT Goal Formulation: With patient/family Time For Goal Achievement: 07/16/17 Potential to Achieve Goals: Good    Frequency Min 5X/week   Barriers to discharge Decreased caregiver support      Co-evaluation               AM-PAC PT "6 Clicks" Daily Activity  Outcome  Measure Difficulty turning over in bed (including adjusting bedclothes, sheets and blankets)?: Unable Difficulty moving from lying on back to sitting on the side of the bed? : Unable Difficulty sitting down on and standing up from a chair with arms (e.g., wheelchair, bedside commode, etc,.)?: Unable Help needed moving to and from a bed to chair (including a wheelchair)?: A Little Help needed walking in hospital room?: A Little Help needed climbing 3-5 steps with a railing? : A Lot 6 Click Score: 11    End of Session Equipment Utilized During Treatment: Gait belt Activity Tolerance: Patient tolerated treatment well Patient left: in chair;with call bell/phone within reach;with family/visitor present Nurse Communication: Mobility status PT Visit Diagnosis: Unsteadiness on feet (R26.81);Pain Pain - part of body: (back)    Time: 1101-1120 PT Time Calculation (min) (ACUTE ONLY): 19 min   Charges:   PT Evaluation $PT Eval Moderate Complexity: 1 Mod     PT G Codes:        Rolinda Roan, PT, DPT Acute Rehabilitation Services Pager: [phone]   Thelma Comp 07/02/2017, 11:37 AM

## 2017-07-02 NOTE — Progress Notes (Addendum)
AT 8:05am foley removed without difficulty with 7cc sterile water removed from baloon, emptied 900cc dark clear yellow urine and tolerated procedure well.

## 2017-07-02 NOTE — Evaluation (Addendum)
Occupational Therapy Evaluation Patient Details Name: Paul Sutton MRN: [id number] DOB: 03-10-1951 Today's Date: 07/02/2017    History of Present Illness Pt is a 67 y/o male now s/p R L4-5 and L5-S1 partial hemilaminectomies with foraminotomy R L5 on 07/01/17; PMHx includes MS, HTN, hypercholesterolemia, left bundle branch block    Clinical Impression   This 67 y/o male presents with the above. Pt lives alone, at baseline reports independence with ADLs and is mod independent with functional mobility using SPC. Pt completing short distance functional mobility within room using RW with MinA this session. Currently requires mod-maxA for LB ADLs secondary to adhering to back precautions. Educated pt/pt family on back precautions, safety and compensatory techniques for completing ADLs while adhering to precautions. Pt will benefit from continued acute OT services and follow up OT services after discharge to maximize his safety and independence with ADLs and mobility. Of note, Pt planning for ST SNF stay prior to return home, given pt's current level of assist and that pt lives alone feel this recommendation is appropriate. Will follow.   Follow Up Recommendations  SNF;Supervision/Assistance - 24 hour    Equipment Recommendations  Other (comment)(TBD in next venue )           Precautions / Restrictions Precautions Precautions: Back Precaution Booklet Issued: Yes (comment) Precaution Comments: issued and reviewed with pt/pt family  Required Braces or Orthoses: (per MD order, no brace needed ) Restrictions Weight Bearing Restrictions: No      Mobility Bed Mobility Overal bed mobility: Needs Assistance Bed Mobility: Rolling;Sidelying to Sit Rolling: Min assist Sidelying to sit: Min assist       General bed mobility comments: assist to guide LEs when rolling into sidelying; assist to elevate trunk into sitting; verbal cues for use of log roll technique   Transfers Overall transfer  level: Needs assistance Equipment used: Rolling walker (2 wheeled) Transfers: Sit to/from Stand Sit to Stand: Mod assist         General transfer comment: assist to boost into standing and steady at RW, VCs for safe hand placement     Balance Overall balance assessment: Needs assistance Sitting-balance support: Feet supported Sitting balance-Leahy Scale: Fair     Standing balance support: Bilateral upper extremity supported Standing balance-Leahy Scale: Poor Standing balance comment: reliant on UE support                            ADL either performed or assessed with clinical judgement   ADL Overall ADL's : Needs assistance/impaired Eating/Feeding: Modified independent;Sitting   Grooming: Wash/dry face;Set up;Sitting   Upper Body Bathing: Min guard;Sitting   Lower Body Bathing: Moderate assistance;Sit to/from stand;Cueing for back precautions   Upper Body Dressing : Sitting;Set up Upper Body Dressing Details (indicate cue type and reason): donning second gown  Lower Body Dressing: Moderate assistance;Sit to/from stand;Cueing for back precautions   Toilet Transfer: Minimal assistance;Ambulation;BSC;RW Toilet Transfer Details (indicate cue type and reason): simulated in transfer bed to chair  Toileting- Clothing Manipulation and Hygiene: Minimal assistance;Cueing for back precautions;Sit to/from stand       Functional mobility during ADLs: Minimal assistance;Rolling walker General ADL Comments: began education regarding back precautions, safety and compensatory techniques for completing ADLs; pt ambulating approx 5' within room using RW to transfer to recliner; began discussion regarding ADL completion using AE, will benefit from practice/further review during next session  Pertinent Vitals/Pain Pain Assessment: 0-10 Pain Score: 6  Pain Location: back Pain Descriptors / Indicators: Guarding;Sore Pain Intervention(s):  Monitored during session;Repositioned;Limited activity within patient's tolerance          Extremity/Trunk Assessment Upper Extremity Assessment Upper Extremity Assessment: Overall WFL for tasks assessed   Lower Extremity Assessment Lower Extremity Assessment: Defer to PT evaluation   Cervical / Trunk Assessment Cervical / Trunk Assessment: Other exceptions Cervical / Trunk Exceptions: s/p lumbar surgery    Communication Communication Communication: No difficulties   Cognition Arousal/Alertness: Awake/alert Behavior During Therapy: WFL for tasks assessed/performed Overall Cognitive Status: Within Functional Limits for tasks assessed                                     General Comments  pt's sister and brother-in-law present during session                Home Living Family/patient expects to be discharged to:: Private residence Living Arrangements: Alone Available Help at Discharge: Family Type of Home: Mobile home Home Access: Stairs to enter Technical brewer of Steps: 2 Entrance Stairs-Rails: Right;Left;Can reach both Home Layout: One level     Bathroom Shower/Tub: Tub/shower unit;Walk-in shower   Bathroom Toilet: Standard     Home Equipment: Cane - single point;Walker - 4 wheels;Shower seat;Adaptive equipment Adaptive Equipment: Reacher        Prior Functioning/Environment Level of Independence: Independent with assistive device(s)        Comments: using SPC        OT Problem List: Decreased strength;Impaired balance (sitting and/or standing);Pain;Decreased knowledge of precautions;Decreased activity tolerance;Decreased knowledge of use of DME or AE      OT Treatment/Interventions: Self-care/ADL training;DME and/or AE instruction;Therapeutic activities;Balance training;Therapeutic exercise;Patient/family education    OT Goals(Current goals can be found in the care plan section) Acute Rehab OT Goals Patient Stated Goal: less  pain, regain independence  OT Goal Formulation: With patient Time For Goal Achievement: 07/16/17 Potential to Achieve Goals: Good  OT Frequency: Min 2X/week                             AM-PAC PT "6 Clicks" Daily Activity     Outcome Measure Help from another person eating meals?: None Help from another person taking care of personal grooming?: A Little Help from another person toileting, which includes using toliet, bedpan, or urinal?: A Little Help from another person bathing (including washing, rinsing, drying)?: A Lot Help from another person to put on and taking off regular upper body clothing?: None Help from another person to put on and taking off regular lower body clothing?: A Lot 6 Click Score: 18   End of Session Equipment Utilized During Treatment: Gait belt;Rolling walker Nurse Communication: Mobility status  Activity Tolerance: Patient tolerated treatment well Patient left: in chair;with call bell/phone within reach;with family/visitor present(nursing student present )  OT Visit Diagnosis: Other abnormalities of gait and mobility (R26.89);Pain Pain - part of body: (back )                Time: 4270-6237 OT Time Calculation (min): 26 min Charges:  OT General Charges $OT Visit: 1 Visit OT Evaluation $OT Eval Moderate Complexity: 1 Mod OT Treatments $Self Care/Home Management : 8-22 mins G-Codes:     Lou Cal, OT Pager [phone] 07/02/2017   Raymondo Band 07/02/2017, 10:53 AM

## 2017-07-02 NOTE — NC FL2 (Signed)
[place] LEVEL OF CARE SCREENING TOOL     IDENTIFICATION  Patient Name: Paul Sutton Birthdate: 07-24-1950 Sex: male Admission Date (Current Location): 07/01/2017  [facility] and [place] Number:  ([place] resident)   Facility and Address:  The . [facility] [place] [address] [place], [place] [address]      Provider Number: [id number]  Attending Physician Name and Address:  Jessy Oto, MD  Relative Name and Phone Number:  Dewitt Rota [phone]    Current Level of Care: Hospital Recommended Level of Care: [place] Prior Approval Number:    Date Approved/Denied:   PASRR Number:    Discharge Plan: SNF    Current Diagnoses: Patient Active Problem List   Diagnosis Date Noted  . Lumbar stenosis with neurogenic claudication 07/01/2017    Class: Chronic  . Multiple sclerosis ([place]) 07/01/2017    Class: Chronic  . S/P lumbar laminectomy 07/01/2017    Orientation RESPIRATION BLADDER Height & Weight     Self, Time, Situation, Place  Normal Continent Weight: 160 lb (72.6 kg) Height:     BEHAVIORAL SYMPTOMS/MOOD NEUROLOGICAL BOWEL NUTRITION STATUS      Continent Diet(See DC Summary)  AMBULATORY STATUS COMMUNICATION OF NEEDS Skin   Limited Assist Verbally Surgical wounds                       Personal Care Assistance Level of Assistance  Bathing, Feeding, Dressing Bathing Assistance: Maximum assistance Feeding assistance: Limited assistance Dressing Assistance: Maximum assistance     Functional Limitations Info  Sight, Hearing, Speech Sight Info: Adequate Hearing Info: Adequate Speech Info: Adequate    SPECIAL CARE FACTORS FREQUENCY  PT (By licensed PT), OT (By licensed OT)     PT Frequency: 5x week OT Frequency: 2x week            Contractures      Additional Factors Info  Code Status, Allergies Code Status Info: Full code Allergies Info: ACETAMINOPHEN            Current  Medications (07/02/2017):  This is the current hospital active medication list Current Facility-Administered Medications  Medication Dose Route Frequency Provider Last Rate Last Dose  . 0.9 %  sodium chloride infusion  250 mL Intravenous Continuous Jessy Oto, MD 1 mL/hr at 07/01/17 1600 250 mL at 07/01/17 1600  . alum & mag hydroxide-simeth (MAALOX/MYLANTA) 200-200-20 MG/5ML suspension 30 mL  30 mL Oral Q6H PRN Jessy Oto, MD      . amLODipine (NORVASC) tablet 10 mg  10 mg Oral QAC breakfast Jessy Oto, MD   10 mg at 07/02/17 0815  . baclofen (LIORESAL) tablet 10 mg  10 mg Oral TID Jessy Oto, MD   10 mg at 07/02/17 1523  . bisacodyl (DULCOLAX) EC tablet 5 mg  5 mg Oral Daily PRN Jessy Oto, MD      . docusate sodium (COLACE) capsule 100 mg  100 mg Oral BID Jessy Oto, MD   100 mg at 07/02/17 0815  . gabapentin (NEURONTIN) capsule 300 mg  300 mg Oral TID Jessy Oto, MD   300 mg at 07/02/17 1524  . HYDROcodone-acetaminophen (NORCO) 10-325 MG per tablet 2 tablet  2 tablet Oral Q4H PRN Jessy Oto, MD   2 tablet at 07/01/17 2138  . HYDROcodone-acetaminophen (NORCO/VICODIN) 5-325 MG per tablet 1 tablet  1 tablet Oral Q4H PRN Jessy Oto, MD      .  meloxicam (MOBIC) tablet 15 mg  15 mg Oral Daily Jessy Oto, MD   15 mg at 07/02/17 0815  . menthol-cetylpyridinium (CEPACOL) lozenge 3 mg  1 lozenge Oral PRN Jessy Oto, MD   3 mg at 07/01/17 2317   Or  . phenol (CHLORASEPTIC) mouth spray 1 spray  1 spray Mouth/Throat PRN Jessy Oto, MD      . morphine 2 MG/ML injection 1 mg  1 mg Intravenous Q2H PRN Jessy Oto, MD      . ondansetron [facility]) tablet 4 mg  4 mg Oral Q6H PRN Jessy Oto, MD       Or  . ondansetron [facility]) injection 4 mg  4 mg Intravenous Q6H PRN Jessy Oto, MD   4 mg at 07/02/17 1415  . polyethylene glycol (MIRALAX / GLYCOLAX) packet 17 g  17 g Oral Daily PRN Jessy Oto, MD      . pravastatin (PRAVACHOL) tablet 20 mg  20 mg Oral  QAC breakfast Jessy Oto, MD   20 mg at 07/02/17 0815  . sodium chloride flush (NS) 0.9 % injection 3 mL  3 mL Intravenous Q12H Jessy Oto, MD   3 mL at 07/02/17 0815  . sodium chloride flush (NS) 0.9 % injection 3 mL  3 mL Intravenous PRN Jessy Oto, MD      . sodium phosphate (FLEET) 7-19 GM/118ML enema 1 enema  1 enema Rectal Once PRN Jessy Oto, MD      . sulfamethoxazole-trimethoprim (BACTRIM,SEPTRA) 400-80 MG per tablet 1 tablet  1 tablet Oral QHS Jessy Oto, MD   1 tablet at 07/01/17 2138  . tiZANidine (ZANAFLEX) tablet 2 mg  2 mg Oral TID Jessy Oto, MD   2 mg at 07/02/17 1524  . traMADol (ULTRAM) tablet 50 mg  50 mg Oral BID PRN Jessy Oto, MD   50 mg at 07/02/17 0815     Discharge Medications: Please see discharge summary for a list of discharge medications.  Relevant Imaging Results:  Relevant Lab Results:   Additional Information SS#: [place], LCSW

## 2017-07-02 NOTE — Care Management Note (Signed)
Case Management Note  Patient Details  Name: KEYVON HERTER MRN: [id number] Date of Birth: 07-09-1950  Subjective/Objective:   Pt is a 67 y/o male now s/p R L4-5 and L5-S1 partial hemilaminectomies with foraminotomy.                 Action/Plan:  Case manager spoke with patient concerning discharge plan. Patient defers to his sister, Otho Ket  [phone], to discuss his choice for shortterm rehab. Patient's sister and daughter say that he wants to go to [facility] for rehab. [facility] is 2nd choice. Patient lives alone and is jjust beginning to get around per sister. Case manager explained that social worker will work on getting a bed for patient. CM provided Education officer, museum with this information.   Expected Discharge Date:   07/04/17               Expected Discharge Plan:  Skilled Nursing Facility  In-House Referral:  Clinical Social Work  Discharge planning Services  CM Consult, [place]  Post Acute Care Choice:  NA Choice offered to:  Patient, Sibling, Adult Children  DME Arranged:  N/A DME Agency:  NA  HH Arranged:  NA HH Agency:  NA  Status of Service:  Completed, signed off  If discussed at [place] of Stay Meetings, dates discussed:    Additional Comments:  Ninfa Meeker, RN 07/02/2017, 11:45 AM

## 2017-07-02 NOTE — Social Work (Signed)
CSW contacted SNF [facility] and left message for Caryl Pina in admissions to if they can offers SNF bed.  CSW will continue to follow up as SNF offers are needed and desired SNF will then need to get Insurance Auth for placement.  CSW left 30 day note on chart to be signed if patient will need SNF placement in [place], he will need PASSR.  CSW will f/u.  Elissa Hefty, LCSW Clinical Social Worker [phone]

## 2017-07-02 NOTE — Progress Notes (Signed)
This Probation officer offered to assist patient to ambulate to the bathroom;patient hesitant and said "i'm not ready to use the bathroom anyway";he wanted to wait for "therapy team" this morning instead and for his foley catheter to be taken out just before the start of PT/OT workup.Family members present by the bedside during this interaction.

## 2017-07-03 ENCOUNTER — Telehealth (INDEPENDENT_AMBULATORY_CARE_PROVIDER_SITE_OTHER): Payer: Self-pay | Admitting: Specialist

## 2017-07-03 MED ORDER — HYDROCODONE-ACETAMINOPHEN 10-325 MG PO TABS: 1.0000 | ORAL_TABLET | Freq: Four times a day (QID) | ORAL | 0 refills | Status: DC | PRN

## 2017-07-03 NOTE — Progress Notes (Signed)
RN called [facility] and gave report to [facility], RN patient comfortable and awaiting ride from sister. Not complaining of any pain or anything

## 2017-07-03 NOTE — Progress Notes (Signed)
RN Called MD office to get discharge order placed so I can print AVS. Secretary said she will send the message over and give RN a call back

## 2017-07-03 NOTE — Progress Notes (Addendum)
Subjective: Doing well.  Pain controlled. No voiding issues.     Objective: Vital signs in last 24 hours: Temp:  [96.5 F (35.8 C)-97.9 F (36.6 C)] 97.9 F (36.6 C) (04/17 0501) Pulse Rate:  [64-65] 65 (04/17 0501) Resp:  [16-18] 18 (04/17 0501) BP: (145-152)/(54-64) 151/54 (04/17 0501) SpO2:  [99 %-100 %] 100 % (04/17 0501)  Intake/Output from previous day: 04/16 0701 - 04/17 0700 In: 993 [P.O.:960; I.V.:33] Out: 900 [Urine:900] Intake/Output this shift: No intake/output data recorded.  No results for input(s): HGB in the last 72 hours. No results for input(s): WBC, RBC, HCT, PLT in the last 72 hours. No results for input(s): NA, K, CL, CO2, BUN, CREATININE, GLUCOSE, CALCIUM in the last 72 hours. Recent Labs    07/01/17 0559  INR 1.07    Exam; Alert and oriented.  NAD.  Dressing c/d/i.        Assessment/Plan: Awaiting snf placement.  Continue present care.     Benjiman Core 07/03/2017, 8:47 AM   Agree that this patient due to multiple sclerosis diagnosis and recent lumbar surgery should go to SNF for continued  Rehabilitation post surgery and when he is independent he may return to his domicile as he lives alone and requires 24 hour/7 day assistance until he is independent. FL-2 is signed.

## 2017-07-03 NOTE — Social Work (Addendum)
CSW discussed offer with patient as SNF [facility] offered a bed. Pt accepted SNF offer.  CSW then confirmed with SNF and they will begin Insurance Auth.  CSW will f/u for disposition as Civil Service fast streamer pending.  1:00pm SNF confirmed Civil Service fast streamer. Pt indicated that he would be going to SNF by sister.  CSW advised floor RN of same.  Elissa Hefty, LCSW Clinical Social Worker [phone]

## 2017-07-03 NOTE — Discharge Summary (Addendum)
Patient ID: Paul Sutton MRN: [id number] DOB/AGE: 1950-04-29 67 y.o.  Admit date: 07/01/2017 Discharge date: 07/03/2017  Admission Diagnoses:  Principal Problem:   Lumbar stenosis with neurogenic claudication Active Problems:   Multiple sclerosis ([place])   S/P lumbar laminectomy   Discharge Diagnoses:  Principal Problem:   Lumbar stenosis with neurogenic claudication Active Problems:   Multiple sclerosis ([facility])   S/P lumbar laminectomy  status post Procedure(s): RIGHT L4-5 AND L5-S1 PARTIAL HEMILAMINECTOMIES WITH FORAMINOTOMY RIGHT L5  Past Medical History:  Diagnosis Date  . Arthritis    generalized   . Cancer [facility])    prostate cancer  . Chronic lower back pain last 25 to 30 years  . Hypercholesterolemia   . Hypertension   . LBBB (left bundle branch block)   . Lumbar radiculopathy   . Multiple sclerosis ([place])   . Neuromuscular disorder ([place])    [place]   . Spinal stenosis of lumbar region with neurogenic claudication   . Wears partial dentures     Surgeries: Procedure(s): RIGHT L4-5 AND L5-S1 PARTIAL HEMILAMINECTOMIES WITH FORAMINOTOMY RIGHT L5 on 07/01/2017   Consultants:   Discharged Condition: Improved  Hospital Course: Paul Sutton is an 67 y.o. male who was admitted 07/01/2017 for operative treatment of Lumbar stenosis with neurogenic claudication. Patient failed conservative treatments (please see the history and physical for the specifics) and had severe unremitting pain that affects sleep, daily activities and work/hobbies. After pre-op clearance, the patient was taken to the operating room on 07/01/2017 and underwent  Procedure(s): RIGHT L4-5 AND L5-S1 PARTIAL HEMILAMINECTOMIES WITH FORAMINOTOMY RIGHT L5.    Patient was given perioperative antibiotics:  Anti-infectives (From admission, onward)   Start     Dose/Rate Route Frequency Ordered Stop   07/01/17 2200  sulfamethoxazole-trimethoprim (BACTRIM DS,SEPTRA DS) 800-160 MG per tablet 0.5 tablet   Status:  Discontinued    Note to Pharmacy:  Continuous     0.5 tablet Oral Daily at bedtime 07/01/17 1547 07/01/17 1551   07/01/17 2200  sulfamethoxazole-trimethoprim (BACTRIM,SEPTRA) 400-80 MG per tablet 1 tablet     1 tablet Oral Daily at bedtime 07/01/17 1551     07/01/17 1600  ceFAZolin (ANCEF) IVPB 2g/100 mL premix     2 g 200 mL/hr over 30 Minutes Intravenous Every 8 hours 07/01/17 1547 07/01/17 2344   07/01/17 0558  ceFAZolin (ANCEF) IVPB 2g/100 mL premix     2 g 200 mL/hr over 30 Minutes Intravenous On call to O.R. 07/01/17 0093 07/01/17 0755       Patient was given sequential compression devices and early ambulation to prevent DVT.   Patient benefited maximally from hospital stay and there were no complications. At the time of discharge, the patient was urinating/moving their bowels without difficulty, tolerating a regular diet, pain is controlled with oral pain medications and they have been cleared by PT/OT.   Recent vital signs:  Patient Vitals for the past 24 hrs:  BP Temp Temp src Pulse Resp SpO2  07/03/17 0501 (!) 151/54 97.9 F (36.6 C) Oral 65 18 100 %  07/02/17 2143 (!) 151/58 97.9 F (36.6 C) Oral 65 16 100 %  07/02/17 1400 (!) 152/60 - - - - -  07/02/17 1041 (!) 145/64 (!) 96.5 F (35.8 C) Oral 64 16 99 %     Recent laboratory studies:  Recent Labs    07/01/17 0559  INR 1.07     Discharge Medications:   Allergies as of 07/03/2017  Reactions   Acetaminophen    UNSPECIFIED REACTION on "kidneys"      Medication List    STOP taking these medications   HYDROcodone-acetaminophen 5-325 MG tablet Commonly known as:  NORCO/VICODIN Replaced by:  HYDROcodone-acetaminophen 10-325 MG tablet   meloxicam 15 MG tablet Commonly known as:  MOBIC   tizanidine 2 MG capsule Commonly known as:  ZANAFLEX   traMADol 50 MG tablet Commonly known as:  ULTRAM     TAKE these medications   amLODipine 10 MG tablet Commonly known as:  NORVASC Take 10 mg by  mouth daily before breakfast.   baclofen 10 MG tablet Commonly known as:  LIORESAL Take 10 mg by mouth 2 (two) times daily as needed. Spasm   gabapentin 300 MG capsule Commonly known as:  NEURONTIN Take one po at night for 5 days then one po BID for 5 days and then one table TID. What changed:    how much to take  how to take this  when to take this  additional instructions   HYDROcodone-acetaminophen 10-325 MG tablet Commonly known as:  NORCO Take 1-2 tablets by mouth every 6 (six) hours as needed for severe pain ((score 7 to 10)). Replaces:  HYDROcodone-acetaminophen 5-325 MG tablet   pravastatin 20 MG tablet Commonly known as:  PRAVACHOL Take 20 mg by mouth daily before breakfast.   sulfamethoxazole-trimethoprim 800-160 MG tablet Commonly known as:  BACTRIM DS,SEPTRA DS Take 0.5 tablets by mouth at bedtime. Continuous            Durable Medical Equipment  (From admission, onward)        Start     Ordered   07/01/17 1548  DME Walker rolling  Once    Question Answer Comment  Patient needs a walker to treat with the following condition Muscle weakness of lower extremity   Patient needs a walker to treat with the following condition Multiple sclerosis ([place])      07/01/17 1547      Diagnostic Studies: Dg Lumbar Spine 2-3 Views  Result Date: 07/01/2017 CLINICAL DATA:  Intraoperative lumbar localization EXAM: LUMBAR SPINE - 2 VIEW COMPARISON:  06/03/2017 FINDINGS: Initial image obtained at 823 hours demonstrates needles within the soft tissues of the lower back at the level of the L4 and L5 spinous processes. Subsequent image at 828 hours shows surgical instrument adjacent to the inferior aspect of the L4 spinous process posterior to the L4-5 disc space. IMPRESSION: Intraoperative localization at L4-5. Electronically Signed   By: Inez Catalina M.D.   On: 07/01/2017 09:51      Follow-up Information    Jessy Oto, MD. Schedule an appointment as soon as  possible for a visit today.   Specialty:  Orthopedic Surgery Why:  need return office visit 2 weeks postop.  Contact information: [place] [address] [phone]           Discharge Plan:  discharge to snf  Disposition:     Signed: Benjiman Core for Basil Dess MD [facility] [phone] 07/03/2017, 8:48 AM

## 2017-07-03 NOTE — Clinical Social Work Placement (Signed)
   CLINICAL SOCIAL WORK PLACEMENT  NOTE  Date:  07/03/2017  Patient Details  Name: Paul Sutton MRN: [id number] Date of Birth: 20-Sep-1950  Clinical Social Work is seeking post-discharge placement for this patient at the [place] level of care (*CSW will initial, date and re-position this form in  chart as items are completed):  Yes   Patient/family provided with [facility] Work Department's list of facilities offering this level of care within the geographic area requested by the patient (or if unable, by the patient's family).  Yes   Patient/family informed of their freedom to choose among providers that offer the needed level of care, that participate in Medicare, Medicaid or managed care program needed by the patient, have an available bed and are willing to accept the patient.  Yes   Patient/family informed of [facility]'s ownership interest in [facility] and [facility], as well as of the fact that they are under no obligation to receive care at these facilities.  PASRR submitted to EDS on       PASRR number received on       Existing PASRR number confirmed on       FL2 transmitted to all facilities in geographic area requested by pt/family on       FL2 transmitted to all facilities within larger geographic area on       Patient informed that his/her managed care company has contracts with or will negotiate with certain facilities, including the following:        Yes   Patient/family informed of bed offers received.  Patient chooses bed at [facility] and [facility]     Physician recommends and patient chooses bed at      Patient to be transferred to [facility] on 07/03/17.  Patient to be transferred to facility by family member     Patient family notified on 07/03/17 of transfer.  Name of family member notified:  pt responsible for self     PHYSICIAN       Additional Comment:     _______________________________________________ [name], LCSW 07/03/2017, 1:21 PM

## 2017-07-03 NOTE — Telephone Encounter (Signed)
Marion from [facility] called asking for a discharge order for the patient so they could print out the patients AVS. CB # [phone]

## 2017-07-03 NOTE — Telephone Encounter (Signed)
Marion from [facility] called asking for a discharge order for the patient so they could print out the patients AVS.

## 2017-07-03 NOTE — Social Work (Signed)
Clinical Social Worker facilitated patient discharge including contacting patient family and facility to confirm patient discharge plans.  Clinical information faxed to facility and family agreeable with plan.    Pt will be transported to [facility] by sister .    RN to call [phone] to give report prior to discharge. Pt going to Room 214.  Clinical Social Worker will sign off for now as social work intervention is no longer needed. Please consult [place] again if new need arises.  Elissa Hefty, LCSW Clinical Social Worker [phone]

## 2017-07-08 ENCOUNTER — Encounter (INDEPENDENT_AMBULATORY_CARE_PROVIDER_SITE_OTHER): Payer: Self-pay

## 2017-07-08 ENCOUNTER — Inpatient Hospital Stay (INDEPENDENT_AMBULATORY_CARE_PROVIDER_SITE_OTHER): Payer: Medicare PPO | Admitting: Specialist

## 2017-07-08 ENCOUNTER — Ambulatory Visit (INDEPENDENT_AMBULATORY_CARE_PROVIDER_SITE_OTHER): Payer: Medicare PPO | Admitting: Specialist

## 2017-07-15 ENCOUNTER — Encounter (INDEPENDENT_AMBULATORY_CARE_PROVIDER_SITE_OTHER): Payer: Self-pay | Admitting: Specialist

## 2017-07-15 ENCOUNTER — Ambulatory Visit (INDEPENDENT_AMBULATORY_CARE_PROVIDER_SITE_OTHER): Payer: Medicare PPO | Admitting: Specialist

## 2017-07-15 VITALS — BP 169/75 | HR 33 | Ht 69.0 in | Wt 160.0 lb

## 2017-07-15 DIAGNOSIS — Z9889 Other specified postprocedural states: Secondary | ICD-10-CM

## 2017-07-15 MED ORDER — HYDROCODONE-ACETAMINOPHEN 10-325 MG PO TABS: 1.0000 | ORAL_TABLET | Freq: Four times a day (QID) | ORAL | 0 refills | Status: DC | PRN

## 2017-07-15 NOTE — Progress Notes (Signed)
   Post-Op Visit Note   Patient: Paul Sutton           Date of Birth: 1950-11-15           MRN: [id number] Visit Date: 07/15/2017 PCP: Glenda Chroman, MD   Assessment & Plan:2 weeks post Right L5-S1 and L4-5 partial hemilaminectomy. Right sciatica is improved.   Chief Complaint:  Chief Complaint  Patient presents with  . Lower Back - Routine Post Op  Incision is healing without erythrema or drainage, minimal swelling  Motor in the legs is normal.  SLR is normal. Visit Diagnoses: No diagnosis found.  Plan:    No lifting greater than 10 lbs. Avoid bending, stooping and twisting. Walk in house at first week then may start slowly increasing distance up to one mile by 3 weeks post op. May bath and get the incision wet. No dressing is necessary.  Follow-Up Instructions: No follow-ups on file.   Orders:  No orders of the defined types were placed in this encounter.  No orders of the defined types were placed in this encounter.   Imaging: No results found.  PMFS History: Patient Active Problem List   Diagnosis Date Noted  . Lumbar stenosis with neurogenic claudication 07/01/2017    Priority: High    Class: Chronic  . Multiple sclerosis ([place]) 07/01/2017    Priority: Medium    Class: Chronic  . S/P lumbar laminectomy 07/01/2017   Past Medical History:  Diagnosis Date  . Arthritis    generalized   . Cancer [facility])    prostate cancer  . Chronic lower back pain last 25 to 30 years  . Hypercholesterolemia   . Hypertension   . LBBB (left bundle branch block)   . Lumbar radiculopathy   . Multiple sclerosis ([place])   . Neuromuscular disorder ([place])    [place]   . Spinal stenosis of lumbar region with neurogenic claudication   . Wears partial dentures     Family History  Problem Relation Age of Onset  . Hypertension Mother   . Diabetes Mother   . Hypertension Father   . Cancer Father   . Diabetes Sister   . Heart disease Brother   . Renal Disease Brother   .  Heart attack Brother   . Stroke Brother     Past Surgical History:  Procedure Laterality Date  . LUMBAR LAMINECTOMY/DECOMPRESSION MICRODISCECTOMY N/A 07/01/2017   Procedure: RIGHT L4-5 AND L5-S1 PARTIAL HEMILAMINECTOMIES WITH FORAMINOTOMY RIGHT L5;  Surgeon: Jessy Oto, MD;  Location: [facility];  Service: Orthopedics;  Laterality: N/A;  . MULTIPLE TOOTH EXTRACTIONS    . NO PAST SURGERIES    . ROBOT ASSISTED LAPAROSCOPIC RADICAL PROSTATECTOMY  06/07/2011   Procedure: ROBOTIC ASSISTED LAPAROSCOPIC RADICAL PROSTATECTOMY LEVEL 2;  Surgeon: Dutch Gray, MD;  Location: [facility];  Service: Urology;  Laterality: N/A;       Social History   Occupational History  . Not on file  Tobacco Use  . Smoking status: Former Smoker    Packs/day: 0.25    Years: 30.00    Pack years: 7.50    Types: Cigarettes    Last attempt to quit: 06/18/2017    Years since quitting: 0.0  . Smokeless tobacco: Never Used  Substance and Sexual Activity  . Alcohol use: No  . Drug use: No  . Sexual activity: Not on file

## 2017-07-15 NOTE — Patient Instructions (Signed)
No lifting greater than 10 lbs. Avoid bending, stooping and twisting. Walk in house at first week then may start slowly increasing distance up to one mile by 3 weeks post op. May bath and get the incision wet. No dressing is necessary.

## 2017-07-24 ENCOUNTER — Ambulatory Visit (INDEPENDENT_AMBULATORY_CARE_PROVIDER_SITE_OTHER): Payer: Medicare PPO | Admitting: Specialist

## 2017-08-15 ENCOUNTER — Encounter (INDEPENDENT_AMBULATORY_CARE_PROVIDER_SITE_OTHER): Payer: Self-pay | Admitting: Surgery

## 2017-08-15 ENCOUNTER — Ambulatory Visit (INDEPENDENT_AMBULATORY_CARE_PROVIDER_SITE_OTHER): Payer: Medicare PPO | Admitting: Surgery

## 2017-08-15 VITALS — BP 138/70 | HR 62 | Ht 69.0 in | Wt 163.0 lb

## 2017-08-15 DIAGNOSIS — Z9889 Other specified postprocedural states: Secondary | ICD-10-CM

## 2017-08-15 MED ORDER — HYDROCODONE-ACETAMINOPHEN 5-325 MG PO TABS: 1.0000 | ORAL_TABLET | Freq: Three times a day (TID) | ORAL | 0 refills | Status: DC | PRN

## 2017-08-15 NOTE — Progress Notes (Signed)
67 year old black male follow-up visit after right L4-5 and L5-S1 partial hemilaminotomies.  States that his back pain is much better than preop symptoms.  Continues to improve.  Please up to this point.  Exam Surgical incision is well-healed.  Negative straight leg raise.  PLAN Patient has been doing some leg strengthening exercises at home and he will continue to do this.  No aggressive activity.  He asked about a riding lawn more told him to hold off on this.  He will follow-up in 6 weeks with Dr. Louanne Skye and he can discuss returning back to doing that activity at that time.  Refilled Norco prescription.

## 2017-09-26 ENCOUNTER — Ambulatory Visit (INDEPENDENT_AMBULATORY_CARE_PROVIDER_SITE_OTHER): Payer: Medicare PPO | Admitting: Specialist

## 2017-09-26 ENCOUNTER — Encounter (INDEPENDENT_AMBULATORY_CARE_PROVIDER_SITE_OTHER): Payer: Self-pay | Admitting: Specialist

## 2017-09-26 VITALS — BP 170/72 | HR 51 | Ht 69.0 in | Wt 163.0 lb

## 2017-09-26 DIAGNOSIS — Z9889 Other specified postprocedural states: Secondary | ICD-10-CM

## 2017-09-26 MED ORDER — HYDROCODONE-ACETAMINOPHEN 5-325 MG PO TABS: 1.0000 | ORAL_TABLET | Freq: Three times a day (TID) | ORAL | 0 refills | Status: DC | PRN

## 2017-09-26 NOTE — Progress Notes (Signed)
   Post-Op Visit Note   Patient: Paul Sutton           Date of Birth: [birth year]           MRN: [id number] Visit Date: 09/26/2017 PCP: Glenda Chroman, MD   Assessment & Plan:3 months post op Lumbar laminectomy Right L4-5 and L5-S1  Chief Complaint:  Chief Complaint  Patient presents with  . Lower Back - Follow-up  Lumbar incision is well healed. Motor is intact both legs.  Visit Diagnoses: No diagnosis found.  Plan:Avoid frequent bending and stooping  No lifting greater than 10 lbs. May use ice or moist heat for pain. Weight loss is of benefit. Handicap license is approved.    Follow-Up Instructions: No follow-ups on file.   Orders:  No orders of the defined types were placed in this encounter.  No orders of the defined types were placed in this encounter.   Imaging: No results found.  PMFS History: Patient Active Problem List   Diagnosis Date Noted  . Lumbar stenosis with neurogenic claudication 07/01/2017    Priority: High    Class: Chronic  . Multiple sclerosis ([place]) 07/01/2017    Priority: Medium    Class: Chronic  . S/P lumbar laminectomy 07/01/2017   Past Medical History:  Diagnosis Date  . Arthritis    generalized   . Cancer [facility])    prostate cancer  . Chronic lower back pain last 25 to 30 years  . Hypercholesterolemia   . Hypertension   . LBBB (left bundle branch block)   . Lumbar radiculopathy   . Multiple sclerosis ([place])   . Neuromuscular disorder ([place])    [place]   . Spinal stenosis of lumbar region with neurogenic claudication   . Wears partial dentures     Family History  Problem Relation Age of Onset  . Hypertension Mother   . Diabetes Mother   . Hypertension Father   . Cancer Father   . Diabetes Sister   . Heart disease Brother   . Renal Disease Brother   . Heart attack Brother   . Stroke Brother     Past Surgical History:  Procedure Laterality Date  . LUMBAR LAMINECTOMY/DECOMPRESSION MICRODISCECTOMY N/A 07/01/2017     Procedure: RIGHT L4-5 AND L5-S1 PARTIAL HEMILAMINECTOMIES WITH FORAMINOTOMY RIGHT L5;  Surgeon: Jessy Oto, MD;  Location: [facility];  Service: Orthopedics;  Laterality: N/A;  . MULTIPLE TOOTH EXTRACTIONS    . NO PAST SURGERIES    . ROBOT ASSISTED LAPAROSCOPIC RADICAL PROSTATECTOMY  06/07/2011   Procedure: ROBOTIC ASSISTED LAPAROSCOPIC RADICAL PROSTATECTOMY LEVEL 2;  Surgeon: Dutch Gray, MD;  Location: [facility];  Service: Urology;  Laterality: N/A;       Social History   Occupational History  . Not on file  Tobacco Use  . Smoking status: Former Smoker    Packs/day: 0.25    Years: 30.00    Pack years: 7.50    Types: Cigarettes    Last attempt to quit: 06/18/2017    Years since quitting: 0.2  . Smokeless tobacco: Never Used  Substance and Sexual Activity  . Alcohol use: No  . Drug use: No  . Sexual activity: Not on file

## 2017-09-26 NOTE — Patient Instructions (Signed)
    Avoid frequent bending and stooping  No lifting greater than 10 lbs. May use ice or moist heat for pain. Weight loss is of benefit. Try Coconut juice/milk 1/4-1/2 cup a day for spasm.

## 2017-09-26 NOTE — Addendum Note (Signed)
Addended by: Basil Dess on: 09/26/2017 05:20 PM   Modules accepted: Orders

## 2017-10-18 ENCOUNTER — Other Ambulatory Visit (INDEPENDENT_AMBULATORY_CARE_PROVIDER_SITE_OTHER): Payer: Self-pay | Admitting: Specialist

## 2017-10-18 MED ORDER — HYDROCODONE-ACETAMINOPHEN 10-325 MG PO TABS: 1.0000 | ORAL_TABLET | ORAL | 0 refills | Status: DC | PRN

## 2017-10-22 DIAGNOSIS — Z6823 Body mass index (BMI) 23.0-23.9, adult: Secondary | ICD-10-CM | POA: Diagnosis not present

## 2017-10-22 DIAGNOSIS — G35 Multiple sclerosis: Secondary | ICD-10-CM | POA: Diagnosis not present

## 2017-10-22 DIAGNOSIS — I10 Essential (primary) hypertension: Secondary | ICD-10-CM | POA: Diagnosis not present

## 2017-10-22 DIAGNOSIS — E7800 Pure hypercholesterolemia, unspecified: Secondary | ICD-10-CM | POA: Diagnosis not present

## 2017-10-22 DIAGNOSIS — Z299 Encounter for prophylactic measures, unspecified: Secondary | ICD-10-CM | POA: Diagnosis not present

## 2017-10-22 DIAGNOSIS — N529 Male erectile dysfunction, unspecified: Secondary | ICD-10-CM | POA: Diagnosis not present

## 2017-11-11 DIAGNOSIS — I10 Essential (primary) hypertension: Secondary | ICD-10-CM | POA: Diagnosis not present

## 2017-11-11 DIAGNOSIS — Z1331 Encounter for screening for depression: Secondary | ICD-10-CM | POA: Diagnosis not present

## 2017-11-11 DIAGNOSIS — Z79899 Other long term (current) drug therapy: Secondary | ICD-10-CM | POA: Diagnosis not present

## 2017-11-11 DIAGNOSIS — Z6823 Body mass index (BMI) 23.0-23.9, adult: Secondary | ICD-10-CM | POA: Diagnosis not present

## 2017-11-11 DIAGNOSIS — Z1339 Encounter for screening examination for other mental health and behavioral disorders: Secondary | ICD-10-CM | POA: Diagnosis not present

## 2017-11-11 DIAGNOSIS — Z7189 Other specified counseling: Secondary | ICD-10-CM | POA: Diagnosis not present

## 2017-11-11 DIAGNOSIS — Z299 Encounter for prophylactic measures, unspecified: Secondary | ICD-10-CM | POA: Diagnosis not present

## 2017-11-11 DIAGNOSIS — Z0000 Encounter for general adult medical examination without abnormal findings: Secondary | ICD-10-CM | POA: Diagnosis not present

## 2017-11-11 DIAGNOSIS — Z125 Encounter for screening for malignant neoplasm of prostate: Secondary | ICD-10-CM | POA: Diagnosis not present

## 2017-11-11 DIAGNOSIS — E7800 Pure hypercholesterolemia, unspecified: Secondary | ICD-10-CM | POA: Diagnosis not present

## 2017-11-11 DIAGNOSIS — R5383 Other fatigue: Secondary | ICD-10-CM | POA: Diagnosis not present

## 2017-11-11 DIAGNOSIS — Z1211 Encounter for screening for malignant neoplasm of colon: Secondary | ICD-10-CM | POA: Diagnosis not present

## 2017-11-11 DIAGNOSIS — F17210 Nicotine dependence, cigarettes, uncomplicated: Secondary | ICD-10-CM | POA: Diagnosis not present

## 2018-03-28 ENCOUNTER — Ambulatory Visit (INDEPENDENT_AMBULATORY_CARE_PROVIDER_SITE_OTHER): Payer: Medicare PPO | Admitting: Specialist

## 2018-04-02 ENCOUNTER — Ambulatory Visit (INDEPENDENT_AMBULATORY_CARE_PROVIDER_SITE_OTHER): Payer: Medicare PPO | Admitting: Specialist

## 2018-04-03 ENCOUNTER — Ambulatory Visit (INDEPENDENT_AMBULATORY_CARE_PROVIDER_SITE_OTHER): Payer: Medicare PPO | Admitting: Specialist

## 2018-07-25 DIAGNOSIS — N529 Male erectile dysfunction, unspecified: Secondary | ICD-10-CM | POA: Diagnosis not present

## 2018-07-25 DIAGNOSIS — F17210 Nicotine dependence, cigarettes, uncomplicated: Secondary | ICD-10-CM | POA: Diagnosis not present

## 2018-07-25 DIAGNOSIS — I10 Essential (primary) hypertension: Secondary | ICD-10-CM | POA: Diagnosis not present

## 2018-07-25 DIAGNOSIS — Z6822 Body mass index (BMI) 22.0-22.9, adult: Secondary | ICD-10-CM | POA: Diagnosis not present

## 2018-07-25 DIAGNOSIS — E7800 Pure hypercholesterolemia, unspecified: Secondary | ICD-10-CM | POA: Diagnosis not present

## 2018-07-25 DIAGNOSIS — Z299 Encounter for prophylactic measures, unspecified: Secondary | ICD-10-CM | POA: Diagnosis not present

## 2018-07-25 DIAGNOSIS — G35 Multiple sclerosis: Secondary | ICD-10-CM | POA: Diagnosis not present

## 2018-08-05 DIAGNOSIS — M48062 Spinal stenosis, lumbar region with neurogenic claudication: Secondary | ICD-10-CM | POA: Diagnosis not present

## 2018-08-05 DIAGNOSIS — M62838 Other muscle spasm: Secondary | ICD-10-CM | POA: Diagnosis not present

## 2018-08-05 DIAGNOSIS — G054 Myelitis in diseases classified elsewhere: Secondary | ICD-10-CM | POA: Diagnosis not present

## 2018-08-05 DIAGNOSIS — H8111 Benign paroxysmal vertigo, right ear: Secondary | ICD-10-CM | POA: Diagnosis not present

## 2018-09-23 IMAGING — CR DG LUMBAR SPINE 2-3V
2 series · 2 of 2 positions shown · non-contrast
Comparison: 06/03/2017

CLINICAL DATA: Intraoperative lumbar localization

EXAM:
LUMBAR SPINE - 2 VIEW

[lateral (1 of 2)]
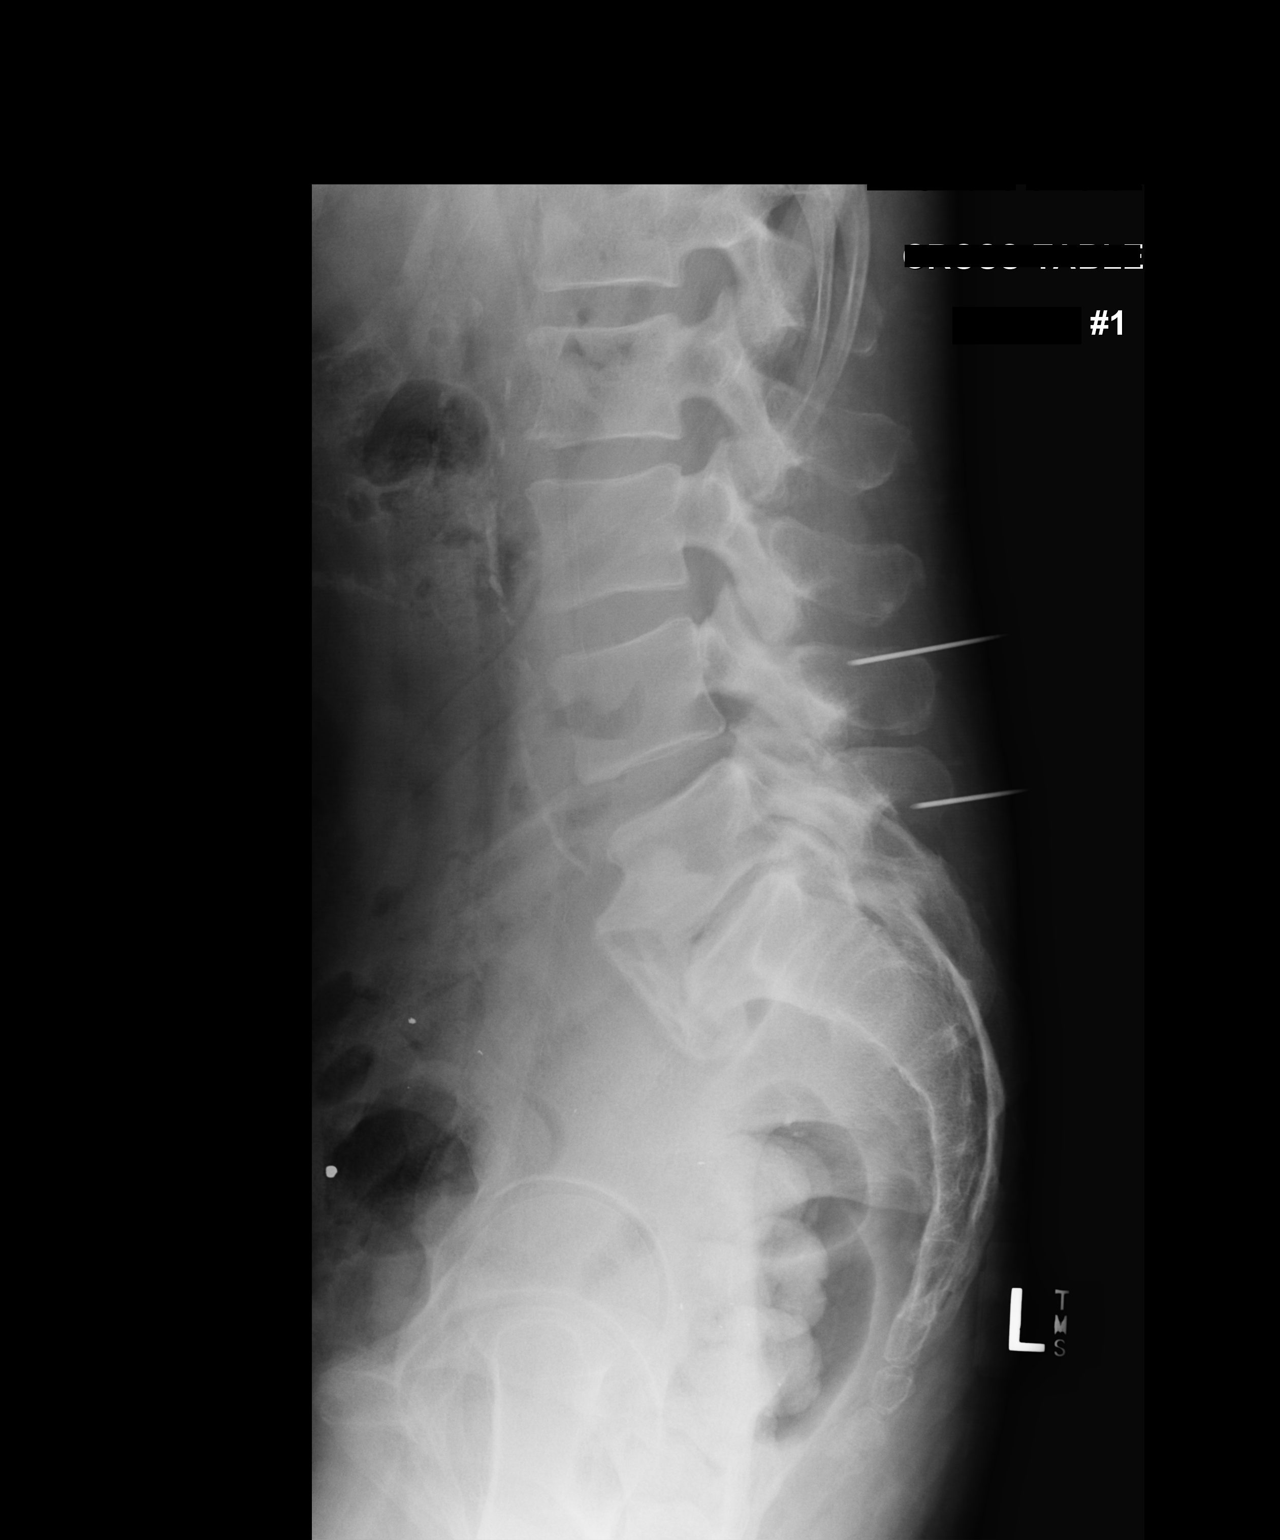

[lateral (2 of 2)]
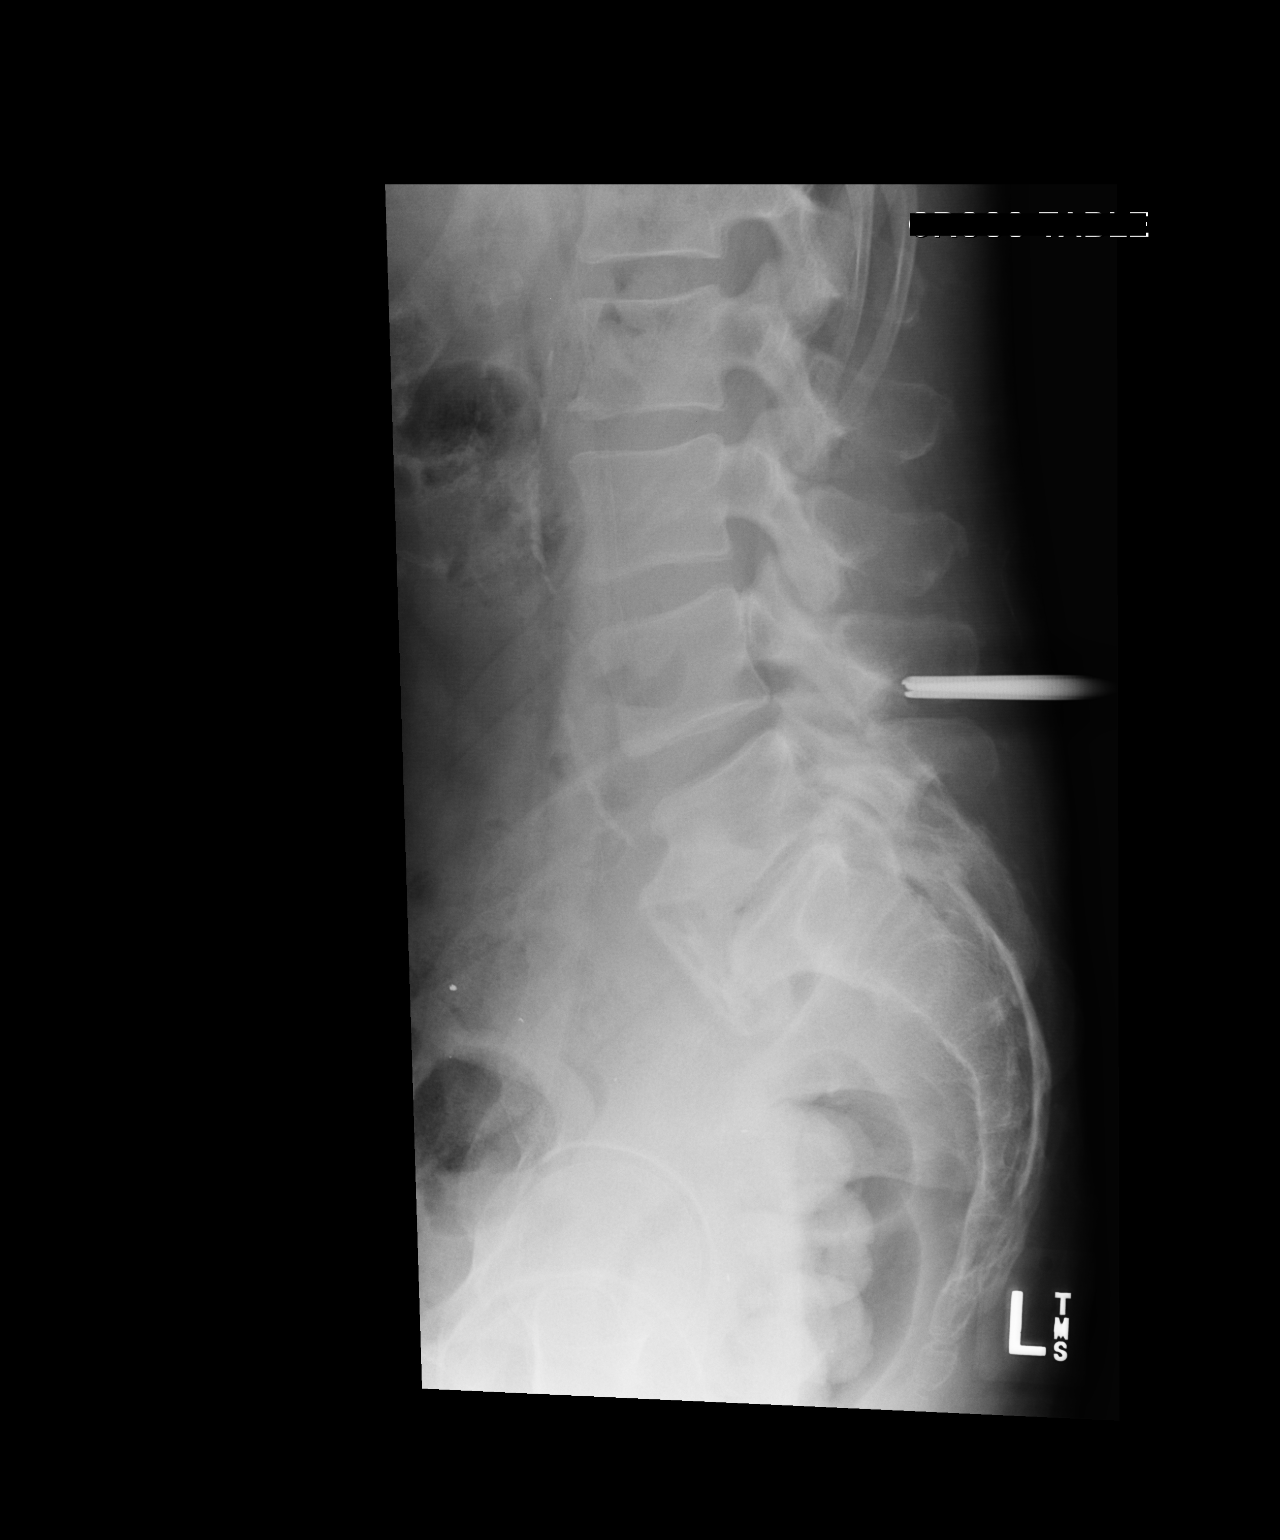

[2 of 2 positions shown; findings below may reference images not displayed]

FINDINGS: Initial image obtained at 823 hours demonstrates needles within the
soft tissues of the lower back at the level of the L4 and L5 spinous
processes. Subsequent image at 828 hours shows surgical instrument
adjacent to the inferior aspect of the L4 spinous process posterior
to the L4-5 disc space.
IMPRESSION: Intraoperative localization at L4-5.

## 2019-01-13 DIAGNOSIS — Z8249 Family history of ischemic heart disease and other diseases of the circulatory system: Secondary | ICD-10-CM | POA: Diagnosis not present

## 2019-01-13 DIAGNOSIS — Z720 Tobacco use: Secondary | ICD-10-CM | POA: Diagnosis not present

## 2019-01-13 DIAGNOSIS — E785 Hyperlipidemia, unspecified: Secondary | ICD-10-CM | POA: Diagnosis not present

## 2019-01-13 DIAGNOSIS — G35 Multiple sclerosis: Secondary | ICD-10-CM | POA: Diagnosis not present

## 2019-01-13 DIAGNOSIS — G8929 Other chronic pain: Secondary | ICD-10-CM | POA: Diagnosis not present

## 2019-01-13 DIAGNOSIS — Z833 Family history of diabetes mellitus: Secondary | ICD-10-CM | POA: Diagnosis not present

## 2019-01-13 DIAGNOSIS — Z809 Family history of malignant neoplasm, unspecified: Secondary | ICD-10-CM | POA: Diagnosis not present

## 2019-01-13 DIAGNOSIS — I10 Essential (primary) hypertension: Secondary | ICD-10-CM | POA: Diagnosis not present

## 2019-01-13 DIAGNOSIS — Z8546 Personal history of malignant neoplasm of prostate: Secondary | ICD-10-CM | POA: Diagnosis not present

## 2019-01-27 DIAGNOSIS — Z299 Encounter for prophylactic measures, unspecified: Secondary | ICD-10-CM | POA: Diagnosis not present

## 2019-01-27 DIAGNOSIS — G35 Multiple sclerosis: Secondary | ICD-10-CM | POA: Diagnosis not present

## 2019-01-27 DIAGNOSIS — Z6822 Body mass index (BMI) 22.0-22.9, adult: Secondary | ICD-10-CM | POA: Diagnosis not present

## 2019-01-27 DIAGNOSIS — F17210 Nicotine dependence, cigarettes, uncomplicated: Secondary | ICD-10-CM | POA: Diagnosis not present

## 2019-01-27 DIAGNOSIS — I10 Essential (primary) hypertension: Secondary | ICD-10-CM | POA: Diagnosis not present

## 2019-01-27 DIAGNOSIS — Z713 Dietary counseling and surveillance: Secondary | ICD-10-CM | POA: Diagnosis not present

## 2019-02-02 DIAGNOSIS — Z125 Encounter for screening for malignant neoplasm of prostate: Secondary | ICD-10-CM | POA: Diagnosis not present

## 2019-02-02 DIAGNOSIS — Z1211 Encounter for screening for malignant neoplasm of colon: Secondary | ICD-10-CM | POA: Diagnosis not present

## 2019-02-02 DIAGNOSIS — E7800 Pure hypercholesterolemia, unspecified: Secondary | ICD-10-CM | POA: Diagnosis not present

## 2019-02-02 DIAGNOSIS — I10 Essential (primary) hypertension: Secondary | ICD-10-CM | POA: Diagnosis not present

## 2019-02-02 DIAGNOSIS — Z1331 Encounter for screening for depression: Secondary | ICD-10-CM | POA: Diagnosis not present

## 2019-02-02 DIAGNOSIS — Z1339 Encounter for screening examination for other mental health and behavioral disorders: Secondary | ICD-10-CM | POA: Diagnosis not present

## 2019-02-02 DIAGNOSIS — Z6822 Body mass index (BMI) 22.0-22.9, adult: Secondary | ICD-10-CM | POA: Diagnosis not present

## 2019-02-02 DIAGNOSIS — Z79899 Other long term (current) drug therapy: Secondary | ICD-10-CM | POA: Diagnosis not present

## 2019-02-02 DIAGNOSIS — Z7189 Other specified counseling: Secondary | ICD-10-CM | POA: Diagnosis not present

## 2019-02-02 DIAGNOSIS — Z0000 Encounter for general adult medical examination without abnormal findings: Secondary | ICD-10-CM | POA: Diagnosis not present

## 2019-02-02 DIAGNOSIS — R5383 Other fatigue: Secondary | ICD-10-CM | POA: Diagnosis not present

## 2019-02-02 DIAGNOSIS — Z299 Encounter for prophylactic measures, unspecified: Secondary | ICD-10-CM | POA: Diagnosis not present

## 2019-10-01 DIAGNOSIS — G35 Multiple sclerosis: Secondary | ICD-10-CM | POA: Diagnosis not present

## 2019-10-01 DIAGNOSIS — E7800 Pure hypercholesterolemia, unspecified: Secondary | ICD-10-CM | POA: Diagnosis not present

## 2019-10-01 DIAGNOSIS — M25551 Pain in right hip: Secondary | ICD-10-CM | POA: Diagnosis not present

## 2019-10-01 DIAGNOSIS — I10 Essential (primary) hypertension: Secondary | ICD-10-CM | POA: Diagnosis not present

## 2019-10-01 DIAGNOSIS — Z299 Encounter for prophylactic measures, unspecified: Secondary | ICD-10-CM | POA: Diagnosis not present

## 2019-11-04 DIAGNOSIS — Z299 Encounter for prophylactic measures, unspecified: Secondary | ICD-10-CM | POA: Diagnosis not present

## 2019-11-04 DIAGNOSIS — I10 Essential (primary) hypertension: Secondary | ICD-10-CM | POA: Diagnosis not present

## 2019-11-04 DIAGNOSIS — G35 Multiple sclerosis: Secondary | ICD-10-CM | POA: Diagnosis not present

## 2019-12-09 DIAGNOSIS — G959 Disease of spinal cord, unspecified: Secondary | ICD-10-CM | POA: Diagnosis not present

## 2020-01-20 DIAGNOSIS — G959 Disease of spinal cord, unspecified: Secondary | ICD-10-CM | POA: Diagnosis not present

## 2020-02-04 DIAGNOSIS — M545 Low back pain, unspecified: Secondary | ICD-10-CM | POA: Diagnosis not present

## 2020-02-04 DIAGNOSIS — Z1339 Encounter for screening examination for other mental health and behavioral disorders: Secondary | ICD-10-CM | POA: Diagnosis not present

## 2020-02-04 DIAGNOSIS — F17210 Nicotine dependence, cigarettes, uncomplicated: Secondary | ICD-10-CM | POA: Diagnosis not present

## 2020-02-04 DIAGNOSIS — I10 Essential (primary) hypertension: Secondary | ICD-10-CM | POA: Diagnosis not present

## 2020-02-04 DIAGNOSIS — Z0000 Encounter for general adult medical examination without abnormal findings: Secondary | ICD-10-CM | POA: Diagnosis not present

## 2020-02-04 DIAGNOSIS — R5383 Other fatigue: Secondary | ICD-10-CM | POA: Diagnosis not present

## 2020-02-04 DIAGNOSIS — Z1331 Encounter for screening for depression: Secondary | ICD-10-CM | POA: Diagnosis not present

## 2020-02-04 DIAGNOSIS — Z125 Encounter for screening for malignant neoplasm of prostate: Secondary | ICD-10-CM | POA: Diagnosis not present

## 2020-02-04 DIAGNOSIS — Z7189 Other specified counseling: Secondary | ICD-10-CM | POA: Diagnosis not present

## 2020-02-04 DIAGNOSIS — E7800 Pure hypercholesterolemia, unspecified: Secondary | ICD-10-CM | POA: Diagnosis not present

## 2020-02-04 DIAGNOSIS — Z23 Encounter for immunization: Secondary | ICD-10-CM | POA: Diagnosis not present

## 2020-02-04 DIAGNOSIS — Z79899 Other long term (current) drug therapy: Secondary | ICD-10-CM | POA: Diagnosis not present

## 2022-11-21 ENCOUNTER — Other Ambulatory Visit (HOSPITAL_COMMUNITY): Payer: Self-pay | Admitting: Nephrology

## 2022-11-21 DIAGNOSIS — I129 Hypertensive chronic kidney disease with stage 1 through stage 4 chronic kidney disease, or unspecified chronic kidney disease: Secondary | ICD-10-CM

## 2022-11-28 ENCOUNTER — Ambulatory Visit (HOSPITAL_COMMUNITY)
Admission: RE | Admit: 2022-11-28 | Discharge: 2022-11-28 | Disposition: A | Discharge status: Home / Self Care | Payer: Medicare HMO | Source: Ambulatory Visit | Attending: Nephrology | Admitting: Nephrology

## 2022-11-28 DIAGNOSIS — I129 Hypertensive chronic kidney disease with stage 1 through stage 4 chronic kidney disease, or unspecified chronic kidney disease: Secondary | ICD-10-CM | POA: Diagnosis present

## 2023-03-24 ENCOUNTER — Telehealth: Payer: Self-pay | Admitting: Cardiology

## 2023-03-24 ENCOUNTER — Encounter (HOSPITAL_COMMUNITY): Payer: Self-pay

## 2023-03-24 NOTE — Telephone Encounter (Addendum)
 CARDIOLOGY ON-CALL  Received a call from [facility] earlier this morning at approximately 11:15 AM regarding this patient's care.  He is currently at [facility]  facility.  Initially the doctor who requested a consult was not able to be reached and therefore [facility] reached out around 12:13 PM.  I spoke to the ER doctor Dr. Wolm Asp regarding the patient's care.  Patient presented to the hospital with shortness of breath which started yesterday and was hypoxic initially requiring nonrebreather followed by BiPAP.  On physical examination the ER physician noted wheezing, decreased breath sounds.  I was told that the chest x-ray is concerning for diffuse edema and concern for possible pneumonia.  He was given Zithromax and other antibiotics.  I was made aware of the following findings: pH 7.43. No WBC count. D-dimer elevated at 1.33. Serum creatinine 1.95. GFR 35. High sensitive troponins initially 85 followed by 1923. BNP greater than 1000  They requested transfer to [facility] as they did not have cardiology service.  The working diagnosis was possible pneumonia/NSTEMI/new onset of heart failure.  The ER physician did not feel comfortable doing a CT PE protocol as his serum creatinine was 1.95 with a GFR of 35 and elevated D-dimer.  Recommended VQ scan but he informs me that such resources are not available.  I was informed the patient was given Lasix and responded well currently on 2 L nasal cannula oxygen at 94%.  I requested that he be admitted to hospitalist team given his comorbid conditions and concerns for cardiac and noncardiac issues.  Cardiology will see in consult when he arrives to [facility].  [facility] to coordinate hospital hospital transfer.  Since the patient still had not arrive to [facility] I had my APP reach out to [facility].  We were informed that the transfer was canceled patient was going to be observed there  overnight and cardiology service will see them in the morning there.  Dyllon Henken [place], DO, FACC

## 2023-07-18 DEATH — deceased
